# Patient Record
Sex: Female | Born: 1950 | Race: White | Hispanic: No | Marital: Married | Smoking: Former smoker
Health system: Southern US, Community
[De-identification: ages and names within clinical notes are randomized; demographics above are authoritative.]

## PROBLEM LIST (undated history)

## (undated) DIAGNOSIS — K759 Inflammatory liver disease, unspecified: Secondary | ICD-10-CM

## (undated) DIAGNOSIS — I6529 Occlusion and stenosis of unspecified carotid artery: Secondary | ICD-10-CM

## (undated) DIAGNOSIS — C801 Malignant (primary) neoplasm, unspecified: Secondary | ICD-10-CM

## (undated) DIAGNOSIS — I4891 Unspecified atrial fibrillation: Secondary | ICD-10-CM

## (undated) DIAGNOSIS — H539 Unspecified visual disturbance: Secondary | ICD-10-CM

## (undated) DIAGNOSIS — I272 Pulmonary hypertension, unspecified: Secondary | ICD-10-CM

## (undated) DIAGNOSIS — R011 Cardiac murmur, unspecified: Secondary | ICD-10-CM

## (undated) DIAGNOSIS — I10 Essential (primary) hypertension: Secondary | ICD-10-CM

## (undated) DIAGNOSIS — I499 Cardiac arrhythmia, unspecified: Secondary | ICD-10-CM

## (undated) DIAGNOSIS — H409 Unspecified glaucoma: Secondary | ICD-10-CM

## (undated) DIAGNOSIS — K219 Gastro-esophageal reflux disease without esophagitis: Secondary | ICD-10-CM

## (undated) DIAGNOSIS — R0600 Dyspnea, unspecified: Secondary | ICD-10-CM

## (undated) DIAGNOSIS — M1990 Unspecified osteoarthritis, unspecified site: Secondary | ICD-10-CM

## (undated) DIAGNOSIS — H9190 Unspecified hearing loss, unspecified ear: Secondary | ICD-10-CM

## (undated) HISTORY — PX: ABDOMINAL HYSTERECTOMY: SHX81

## (undated) HISTORY — PX: EYE SURGERY: SHX253

## (undated) HISTORY — DX: Essential (primary) hypertension: I10

## (undated) HISTORY — PX: COLONOSCOPY: SHX174

## (undated) HISTORY — DX: Cardiac murmur, unspecified: R01.1

## (undated) HISTORY — PX: SKIN CANCER EXCISION: SHX779

## (undated) HISTORY — PX: BREAST LUMPECTOMY: SHX2

---

## 1981-05-24 HISTORY — PX: TUBAL LIGATION: SHX77

## 1998-10-16 ENCOUNTER — Other Ambulatory Visit: Admission: RE | Admit: 1998-10-16 | Discharge: 1998-10-16 | Payer: Self-pay | Admitting: Obstetrics & Gynecology

## 1999-11-18 ENCOUNTER — Other Ambulatory Visit: Admission: RE | Admit: 1999-11-18 | Discharge: 1999-11-18 | Payer: Self-pay | Admitting: Obstetrics & Gynecology

## 2000-06-24 ENCOUNTER — Encounter: Payer: Self-pay | Admitting: General Surgery

## 2000-06-24 ENCOUNTER — Encounter: Admission: RE | Admit: 2000-06-24 | Discharge: 2000-06-24 | Payer: Self-pay | Admitting: General Surgery

## 2000-07-07 ENCOUNTER — Encounter: Admission: RE | Admit: 2000-07-07 | Discharge: 2000-07-07 | Payer: Self-pay | Admitting: General Surgery

## 2000-07-07 ENCOUNTER — Encounter: Payer: Self-pay | Admitting: General Surgery

## 2000-07-26 ENCOUNTER — Ambulatory Visit (HOSPITAL_BASED_OUTPATIENT_CLINIC_OR_DEPARTMENT_OTHER): Admission: RE | Admit: 2000-07-26 | Discharge: 2000-07-26 | Payer: Self-pay | Admitting: General Surgery

## 2000-08-08 ENCOUNTER — Encounter: Admission: RE | Admit: 2000-08-08 | Discharge: 2000-08-08 | Payer: Self-pay | Admitting: General Surgery

## 2000-08-08 ENCOUNTER — Encounter: Payer: Self-pay | Admitting: General Surgery

## 2001-05-29 ENCOUNTER — Other Ambulatory Visit: Admission: RE | Admit: 2001-05-29 | Discharge: 2001-05-29 | Payer: Self-pay | Admitting: Obstetrics & Gynecology

## 2002-10-05 ENCOUNTER — Other Ambulatory Visit: Admission: RE | Admit: 2002-10-05 | Discharge: 2002-10-05 | Payer: Self-pay | Admitting: Obstetrics & Gynecology

## 2012-06-22 ENCOUNTER — Encounter: Payer: Self-pay | Admitting: Gastroenterology

## 2013-03-08 ENCOUNTER — Other Ambulatory Visit: Payer: Self-pay | Admitting: Otolaryngology

## 2013-03-08 DIAGNOSIS — R1310 Dysphagia, unspecified: Secondary | ICD-10-CM

## 2013-03-14 ENCOUNTER — Ambulatory Visit
Admission: RE | Admit: 2013-03-14 | Discharge: 2013-03-14 | Disposition: A | Discharge status: Home / Self Care | Source: Ambulatory Visit | Attending: Otolaryngology | Admitting: Otolaryngology

## 2013-03-14 DIAGNOSIS — R1310 Dysphagia, unspecified: Secondary | ICD-10-CM

## 2013-04-06 ENCOUNTER — Encounter: Payer: Self-pay | Admitting: Gastroenterology

## 2013-12-28 DIAGNOSIS — H6120 Impacted cerumen, unspecified ear: Secondary | ICD-10-CM | POA: Diagnosis not present

## 2013-12-28 DIAGNOSIS — K219 Gastro-esophageal reflux disease without esophagitis: Secondary | ICD-10-CM | POA: Diagnosis not present

## 2013-12-28 DIAGNOSIS — J019 Acute sinusitis, unspecified: Secondary | ICD-10-CM | POA: Diagnosis not present

## 2013-12-28 DIAGNOSIS — H6980 Other specified disorders of Eustachian tube, unspecified ear: Secondary | ICD-10-CM | POA: Diagnosis not present

## 2014-01-18 DIAGNOSIS — H2510 Age-related nuclear cataract, unspecified eye: Secondary | ICD-10-CM | POA: Diagnosis not present

## 2014-01-18 DIAGNOSIS — H4011X0 Primary open-angle glaucoma, stage unspecified: Secondary | ICD-10-CM | POA: Diagnosis not present

## 2014-01-18 DIAGNOSIS — H409 Unspecified glaucoma: Secondary | ICD-10-CM | POA: Diagnosis not present

## 2014-01-23 DIAGNOSIS — H4010X0 Unspecified open-angle glaucoma, stage unspecified: Secondary | ICD-10-CM | POA: Diagnosis not present

## 2014-01-23 DIAGNOSIS — M25559 Pain in unspecified hip: Secondary | ICD-10-CM | POA: Diagnosis not present

## 2014-01-23 DIAGNOSIS — H409 Unspecified glaucoma: Secondary | ICD-10-CM | POA: Diagnosis not present

## 2014-02-13 DIAGNOSIS — M25559 Pain in unspecified hip: Secondary | ICD-10-CM | POA: Diagnosis not present

## 2014-02-13 DIAGNOSIS — M1610 Unilateral primary osteoarthritis, unspecified hip: Secondary | ICD-10-CM | POA: Diagnosis not present

## 2014-02-13 DIAGNOSIS — R195 Other fecal abnormalities: Secondary | ICD-10-CM | POA: Diagnosis not present

## 2014-02-19 ENCOUNTER — Ambulatory Visit: Payer: Medicare Other | Attending: Family Medicine | Admitting: Physical Therapy

## 2014-02-19 DIAGNOSIS — IMO0001 Reserved for inherently not codable concepts without codable children: Secondary | ICD-10-CM | POA: Diagnosis not present

## 2014-02-19 DIAGNOSIS — R5381 Other malaise: Secondary | ICD-10-CM | POA: Insufficient documentation

## 2014-02-19 DIAGNOSIS — M25559 Pain in unspecified hip: Secondary | ICD-10-CM | POA: Diagnosis not present

## 2014-02-21 ENCOUNTER — Ambulatory Visit: Payer: Medicare Other | Attending: Family Medicine | Admitting: Physical Therapy

## 2014-02-21 DIAGNOSIS — M25551 Pain in right hip: Secondary | ICD-10-CM | POA: Insufficient documentation

## 2014-02-21 DIAGNOSIS — Z5189 Encounter for other specified aftercare: Secondary | ICD-10-CM | POA: Insufficient documentation

## 2014-02-21 DIAGNOSIS — R5381 Other malaise: Secondary | ICD-10-CM | POA: Diagnosis not present

## 2014-02-25 DIAGNOSIS — K225 Diverticulum of esophagus, acquired: Secondary | ICD-10-CM | POA: Diagnosis not present

## 2014-02-25 DIAGNOSIS — K219 Gastro-esophageal reflux disease without esophagitis: Secondary | ICD-10-CM | POA: Diagnosis not present

## 2014-02-25 DIAGNOSIS — K921 Melena: Secondary | ICD-10-CM | POA: Diagnosis not present

## 2014-02-26 ENCOUNTER — Ambulatory Visit: Payer: Medicare Other | Admitting: Physical Therapy

## 2014-02-26 DIAGNOSIS — Z5189 Encounter for other specified aftercare: Secondary | ICD-10-CM | POA: Diagnosis not present

## 2014-02-28 ENCOUNTER — Ambulatory Visit: Payer: Medicare Other | Admitting: Physical Therapy

## 2014-02-28 DIAGNOSIS — Z5189 Encounter for other specified aftercare: Secondary | ICD-10-CM | POA: Diagnosis not present

## 2014-03-27 DIAGNOSIS — Z23 Encounter for immunization: Secondary | ICD-10-CM | POA: Diagnosis not present

## 2014-03-29 DIAGNOSIS — M5126 Other intervertebral disc displacement, lumbar region: Secondary | ICD-10-CM | POA: Diagnosis not present

## 2014-03-29 DIAGNOSIS — M5136 Other intervertebral disc degeneration, lumbar region: Secondary | ICD-10-CM | POA: Diagnosis not present

## 2014-03-29 DIAGNOSIS — M5137 Other intervertebral disc degeneration, lumbosacral region: Secondary | ICD-10-CM | POA: Diagnosis not present

## 2014-03-29 DIAGNOSIS — M5127 Other intervertebral disc displacement, lumbosacral region: Secondary | ICD-10-CM | POA: Diagnosis not present

## 2014-05-07 DIAGNOSIS — M25461 Effusion, right knee: Secondary | ICD-10-CM | POA: Diagnosis not present

## 2014-05-07 DIAGNOSIS — M25561 Pain in right knee: Secondary | ICD-10-CM | POA: Diagnosis not present

## 2014-05-08 DIAGNOSIS — M25461 Effusion, right knee: Secondary | ICD-10-CM | POA: Diagnosis not present

## 2014-05-08 DIAGNOSIS — M25561 Pain in right knee: Secondary | ICD-10-CM | POA: Diagnosis not present

## 2014-05-13 DIAGNOSIS — J22 Unspecified acute lower respiratory infection: Secondary | ICD-10-CM | POA: Diagnosis not present

## 2014-05-20 DIAGNOSIS — J40 Bronchitis, not specified as acute or chronic: Secondary | ICD-10-CM | POA: Diagnosis not present

## 2014-05-28 DIAGNOSIS — M25461 Effusion, right knee: Secondary | ICD-10-CM | POA: Diagnosis not present

## 2014-05-28 DIAGNOSIS — M25561 Pain in right knee: Secondary | ICD-10-CM | POA: Diagnosis not present

## 2014-07-03 DIAGNOSIS — Z9889 Other specified postprocedural states: Secondary | ICD-10-CM | POA: Diagnosis not present

## 2014-07-03 DIAGNOSIS — Z9841 Cataract extraction status, right eye: Secondary | ICD-10-CM | POA: Diagnosis not present

## 2014-07-03 DIAGNOSIS — H4011X3 Primary open-angle glaucoma, severe stage: Secondary | ICD-10-CM | POA: Diagnosis not present

## 2014-07-03 DIAGNOSIS — H2512 Age-related nuclear cataract, left eye: Secondary | ICD-10-CM | POA: Diagnosis not present

## 2014-07-23 DIAGNOSIS — M25561 Pain in right knee: Secondary | ICD-10-CM | POA: Diagnosis not present

## 2014-07-23 DIAGNOSIS — M25572 Pain in left ankle and joints of left foot: Secondary | ICD-10-CM | POA: Diagnosis not present

## 2014-08-01 DIAGNOSIS — H4011X3 Primary open-angle glaucoma, severe stage: Secondary | ICD-10-CM | POA: Diagnosis not present

## 2014-08-01 DIAGNOSIS — H2512 Age-related nuclear cataract, left eye: Secondary | ICD-10-CM | POA: Diagnosis not present

## 2014-08-12 DIAGNOSIS — R011 Cardiac murmur, unspecified: Secondary | ICD-10-CM | POA: Diagnosis not present

## 2014-08-12 DIAGNOSIS — I10 Essential (primary) hypertension: Secondary | ICD-10-CM | POA: Diagnosis not present

## 2014-08-14 ENCOUNTER — Ambulatory Visit (INDEPENDENT_AMBULATORY_CARE_PROVIDER_SITE_OTHER): Payer: Medicare Other | Admitting: Cardiovascular Disease

## 2014-08-14 ENCOUNTER — Encounter: Payer: Self-pay | Admitting: Cardiovascular Disease

## 2014-08-14 VITALS — BP 130/74 | HR 70 | Ht 64.0 in | Wt 139.4 lb

## 2014-08-14 DIAGNOSIS — I341 Nonrheumatic mitral (valve) prolapse: Secondary | ICD-10-CM

## 2014-08-14 DIAGNOSIS — I340 Nonrheumatic mitral (valve) insufficiency: Secondary | ICD-10-CM

## 2014-08-14 NOTE — Progress Notes (Signed)
Cardiology Office Note   Date:  08/14/2014   ID:  Laura Knapp, Laura Knapp [birth year], MRN [id number]  PCP:  No primary care provider on file.  Cardiologist:   Ilyssa Grennan, Wonda Cheng, MD   Chief Complaint  Patient presents with  . Heart Murmur   Problem List 1 Essential Hypertension 2. Heart murmur   August 14, 2014:    Laura Knapp is a 64 y.o. female who presents for evaluation of a heart murmur/ She has occasional palpitation that are are associated with brief chest pain .  She typically walks regularly but injurred her knee in December and has not been able to walk much since that time. She's not had any episodes of chest pain or shortness breath with exercise. She denies any syncope or presyncope.    Past Medical History  Diagnosis Date  . Hypertension   . Heart murmur     Past Surgical History  Procedure Laterality Date  . Breast lumpectomy      BOTH BREAST  . Tubal ligation  1983     Current Outpatient Prescriptions  Medication Sig Dispense Refill  . cyclobenzaprine (FLEXERIL) 10 MG tablet Take 10 mg by mouth 3 (three) times daily as needed for muscle spasms.    Marland Kitchen dexlansoprazole (DEXILANT) 60 MG capsule Take 60 mg by mouth daily.    . dorzolamide-timolol (COSOPT) 22.3-6.8 MG/ML ophthalmic solution Place 1 drop into both eyes 2 (two) times daily.    Marland Kitchen ibuprofen (ADVIL,MOTRIN) 200 MG tablet Take 200 mg by mouth every 6 (six) hours as needed.    . latanoprost (XALATAN) 0.005 % ophthalmic solution Place 1 drop into both eyes at bedtime.    Marland Kitchen lisinopril (PRINIVIL,ZESTRIL) 10 MG tablet Take 10 mg by mouth daily. 1/2 TABLET DAILY    . pilocarpine (PILOCAR) 4 % ophthalmic solution Place 1 drop into both eyes 4 (four) times daily.    Marland Kitchen zolpidem (AMBIEN) 10 MG tablet Take 10 mg by mouth at bedtime as needed for sleep.     No current facility-administered medications for this visit.    Allergies:   Codeine and Acetazolamide    Social History:  The patient  reports that  she has never smoked. She does not have any smokeless tobacco history on file. She reports that she does not drink alcohol or use illicit drugs.   Family History:  The patient's family history includes Heart Problems in her father; Heart disease in her paternal grandfather; Thyroid disease in her brother.    ROS:  Please see the history of present illness.    Review of Systems: Constitutional:  denies fever, chills, diaphoresis, appetite change and fatigue.  HEENT: admits to diplopia and glucome   Respiratory: denies SOB, DOE, cough, chest tightness, and wheezing.  Cardiovascular: admits to  palpitations   Gastrointestinal: denies nausea, vomiting, abdominal pain, diarrhea, constipation, blood in stool.  Genitourinary: denies dysuria, urgency, frequency, hematuria, flank pain and difficulty urinating.  Musculoskeletal: denies  myalgias, back pain, joint swelling, arthralgias and gait problem.   Skin: denies pallor, rash and wound.  Neurological: denies dizziness, seizures, syncope, weakness, light-headedness, numbness and headaches.   Hematological: denies adenopathy, easy bruising, personal or family bleeding history.  Psychiatric/ Behavioral: denies suicidal ideation, mood changes, confusion, nervousness, sleep disturbance and agitation.       All other systems are reviewed and negative.    PHYSICAL EXAM: VS:  BP 130/74 mmHg  Pulse 70  Ht 5\' 4"  (1.626 m)  Wt 139  lb 6.4 oz (63.231 kg)  BMI 23.92 kg/m2 , BMI Body mass index is 23.92 kg/(m^2). GEN: Well nourished, well developed, in no acute distress HEENT: normal Neck: no JVD, carotid bruits, or masses Cardiac: RR;she has a 2/6 systolic murmur at the left sternal border.  I was not able to hear any murmur toward the axilla.  No  rubs, or gallops,no edema  Respiratory:  clear to auscultation bilaterally, normal work of breathing GI: soft, nontender, nondistended, + BS MS: no deformity or atrophy Skin: warm and dry, no  rash Neuro:  Strength and sensation are intact Psych: normal   EKG:  EKG is ordered today. The ekg ordered today demonstrates NSR at 70.  Normal ECG .    Recent Labs: No results found for requested labs within last 365 days.    Lipid Panel No results found for: CHOL, TRIG, HDL, CHOLHDL, VLDL, LDLCALC, LDLDIRECT    Wt Readings from Last 3 Encounters:  08/14/14 139 lb 6.4 oz (63.231 kg)      Other studies Reviewed: Additional studies/ records that were reviewed today include: . Review of the above records demonstrates:    ASSESSMENT AND PLAN:  1.  Systolic heart murmur: The patient has a late peaking systolic murmur consistent with mitral valve prolapse and mitral regurgitation. She does not have any signs or symptoms of congestive heart failure. We will get an echo card gram for further assessment of her valvular function. I'll plan on seeing her again in 6 months. I'll see her yearly after that. At this point I do not think that she needs any medication changes.   Current medicines are reviewed at length with the patient today.  The patient does not have concerns regarding medicines.  The following changes have been made:  no change  Labs/ tests ordered today include:  No orders of the defined types were placed in this encounter.     Disposition:   FU with me in 6 months.     Signed, Eames Dibiasio, Wonda Cheng, MD  08/14/2014 2:55 PM    [address], [place],   [address] Phone: [phone]; Fax: [phone]

## 2014-08-14 NOTE — Patient Instructions (Signed)
Your physician recommends that you continue on your current medications as directed. Please refer to the Current Medication list given to you today.  Your physician has requested that you have an echocardiogram. Echocardiography is a painless test that uses sound waves to create images of your heart. It provides your doctor with information about the size and shape of your heart and how well your heart's chambers and valves are working. This procedure takes approximately one hour. There are no restrictions for this procedure.  Your physician wants you to follow-up in: 6 month follow up with Dr. Acie Fredrickson. You will receive a reminder letter in the mail two months in advance. If you don't receive a letter, please call our office to schedule the follow-up appointment.

## 2014-08-20 DIAGNOSIS — M25572 Pain in left ankle and joints of left foot: Secondary | ICD-10-CM | POA: Diagnosis not present

## 2014-08-20 DIAGNOSIS — M25561 Pain in right knee: Secondary | ICD-10-CM | POA: Diagnosis not present

## 2014-08-21 ENCOUNTER — Ambulatory Visit (HOSPITAL_COMMUNITY): Payer: Medicare Other | Attending: Cardiovascular Disease

## 2014-08-21 DIAGNOSIS — I341 Nonrheumatic mitral (valve) prolapse: Secondary | ICD-10-CM | POA: Insufficient documentation

## 2014-08-21 DIAGNOSIS — I509 Heart failure, unspecified: Secondary | ICD-10-CM | POA: Diagnosis present

## 2014-08-21 DIAGNOSIS — I340 Nonrheumatic mitral (valve) insufficiency: Secondary | ICD-10-CM | POA: Diagnosis not present

## 2014-08-21 NOTE — Progress Notes (Signed)
2D Echo completed. 08/21/2014

## 2014-08-26 DIAGNOSIS — G4700 Insomnia, unspecified: Secondary | ICD-10-CM | POA: Diagnosis not present

## 2014-08-26 DIAGNOSIS — I10 Essential (primary) hypertension: Secondary | ICD-10-CM | POA: Diagnosis not present

## 2014-08-27 DIAGNOSIS — M25561 Pain in right knee: Secondary | ICD-10-CM | POA: Diagnosis not present

## 2014-09-03 DIAGNOSIS — M25572 Pain in left ankle and joints of left foot: Secondary | ICD-10-CM | POA: Diagnosis not present

## 2014-09-03 DIAGNOSIS — M25461 Effusion, right knee: Secondary | ICD-10-CM | POA: Diagnosis not present

## 2014-09-16 DIAGNOSIS — H4011X3 Primary open-angle glaucoma, severe stage: Secondary | ICD-10-CM | POA: Diagnosis not present

## 2014-09-16 DIAGNOSIS — H43812 Vitreous degeneration, left eye: Secondary | ICD-10-CM | POA: Diagnosis not present

## 2014-11-01 DIAGNOSIS — H4011X3 Primary open-angle glaucoma, severe stage: Secondary | ICD-10-CM | POA: Diagnosis not present

## 2014-11-01 DIAGNOSIS — H2512 Age-related nuclear cataract, left eye: Secondary | ICD-10-CM | POA: Diagnosis not present

## 2014-11-01 DIAGNOSIS — Z87891 Personal history of nicotine dependence: Secondary | ICD-10-CM | POA: Diagnosis not present

## 2015-01-10 DIAGNOSIS — H4011X3 Primary open-angle glaucoma, severe stage: Secondary | ICD-10-CM | POA: Diagnosis not present

## 2015-02-12 DIAGNOSIS — I10 Essential (primary) hypertension: Secondary | ICD-10-CM | POA: Diagnosis not present

## 2015-02-12 DIAGNOSIS — J22 Unspecified acute lower respiratory infection: Secondary | ICD-10-CM | POA: Diagnosis not present

## 2015-03-13 DIAGNOSIS — M79676 Pain in unspecified toe(s): Secondary | ICD-10-CM | POA: Diagnosis not present

## 2015-03-13 DIAGNOSIS — B351 Tinea unguium: Secondary | ICD-10-CM | POA: Diagnosis not present

## 2015-04-03 DIAGNOSIS — H401133 Primary open-angle glaucoma, bilateral, severe stage: Secondary | ICD-10-CM | POA: Diagnosis not present

## 2015-04-14 DIAGNOSIS — H401133 Primary open-angle glaucoma, bilateral, severe stage: Secondary | ICD-10-CM | POA: Diagnosis not present

## 2015-07-07 DIAGNOSIS — R0602 Shortness of breath: Secondary | ICD-10-CM | POA: Diagnosis not present

## 2015-07-07 DIAGNOSIS — I10 Essential (primary) hypertension: Secondary | ICD-10-CM | POA: Diagnosis not present

## 2015-07-07 DIAGNOSIS — R011 Cardiac murmur, unspecified: Secondary | ICD-10-CM | POA: Diagnosis not present

## 2015-07-07 DIAGNOSIS — F5101 Primary insomnia: Secondary | ICD-10-CM | POA: Diagnosis not present

## 2015-07-07 DIAGNOSIS — R600 Localized edema: Secondary | ICD-10-CM | POA: Diagnosis not present

## 2015-07-09 DIAGNOSIS — R0602 Shortness of breath: Secondary | ICD-10-CM | POA: Diagnosis not present

## 2015-07-09 DIAGNOSIS — R600 Localized edema: Secondary | ICD-10-CM | POA: Diagnosis not present

## 2015-07-09 DIAGNOSIS — I10 Essential (primary) hypertension: Secondary | ICD-10-CM | POA: Diagnosis not present

## 2015-07-09 DIAGNOSIS — I341 Nonrheumatic mitral (valve) prolapse: Secondary | ICD-10-CM | POA: Diagnosis not present

## 2015-07-24 DIAGNOSIS — I341 Nonrheumatic mitral (valve) prolapse: Secondary | ICD-10-CM | POA: Diagnosis not present

## 2015-07-24 DIAGNOSIS — R0602 Shortness of breath: Secondary | ICD-10-CM | POA: Diagnosis not present

## 2015-07-24 DIAGNOSIS — R0989 Other specified symptoms and signs involving the circulatory and respiratory systems: Secondary | ICD-10-CM | POA: Diagnosis not present

## 2015-07-28 DIAGNOSIS — I4891 Unspecified atrial fibrillation: Secondary | ICD-10-CM | POA: Diagnosis not present

## 2015-07-28 DIAGNOSIS — I340 Nonrheumatic mitral (valve) insufficiency: Secondary | ICD-10-CM | POA: Diagnosis not present

## 2015-07-28 DIAGNOSIS — R600 Localized edema: Secondary | ICD-10-CM | POA: Diagnosis not present

## 2015-07-28 DIAGNOSIS — R0602 Shortness of breath: Secondary | ICD-10-CM | POA: Diagnosis not present

## 2015-08-04 DIAGNOSIS — I4891 Unspecified atrial fibrillation: Secondary | ICD-10-CM | POA: Diagnosis not present

## 2015-08-04 DIAGNOSIS — R0602 Shortness of breath: Secondary | ICD-10-CM | POA: Diagnosis not present

## 2015-08-04 DIAGNOSIS — I340 Nonrheumatic mitral (valve) insufficiency: Secondary | ICD-10-CM | POA: Diagnosis not present

## 2015-08-04 DIAGNOSIS — I10 Essential (primary) hypertension: Secondary | ICD-10-CM | POA: Diagnosis not present

## 2015-08-15 DIAGNOSIS — H1045 Other chronic allergic conjunctivitis: Secondary | ICD-10-CM | POA: Diagnosis not present

## 2015-08-15 DIAGNOSIS — Q132 Other congenital malformations of iris: Secondary | ICD-10-CM | POA: Diagnosis not present

## 2015-08-15 DIAGNOSIS — H1031 Unspecified acute conjunctivitis, right eye: Secondary | ICD-10-CM | POA: Diagnosis not present

## 2015-08-17 DIAGNOSIS — I4891 Unspecified atrial fibrillation: Secondary | ICD-10-CM | POA: Diagnosis not present

## 2015-08-20 DIAGNOSIS — I480 Paroxysmal atrial fibrillation: Secondary | ICD-10-CM | POA: Diagnosis not present

## 2015-08-20 DIAGNOSIS — R0602 Shortness of breath: Secondary | ICD-10-CM | POA: Diagnosis not present

## 2015-08-20 DIAGNOSIS — I340 Nonrheumatic mitral (valve) insufficiency: Secondary | ICD-10-CM | POA: Diagnosis not present

## 2015-08-20 DIAGNOSIS — I6522 Occlusion and stenosis of left carotid artery: Secondary | ICD-10-CM | POA: Diagnosis not present

## 2015-08-27 DIAGNOSIS — I10 Essential (primary) hypertension: Secondary | ICD-10-CM | POA: Diagnosis not present

## 2015-09-08 NOTE — H&P (Signed)
OFFICE VISIT NOTES COPIED TO EPIC FOR DOCUMENTATION  Eliana Thelin 09-13-15 8:25 AM Location: [facility] Patient #: [id number] DOB: May 22, 1951 Married / Language: Cleophus Molt / Race: White Female   History of Present Illness Neldon Labella AGNP-C; 13-Sep-2015 11:10 AM) The patient is a 65 year old female who presents for a Follow-up for Shortness of breath. She presented for evaluation of worsening dyspnea on exertion over the past month with associated lower extremity edema and orthopnea. She was seen by a cardiologist approximately one year ago and diagnosed with moderate mitral valve prolapse with MR and moderate hypertension at that time. She has not followed up since then. She was recently seen by her PCP for significantly worsening shortness of breath and had lab work which revealed BNP of 253. She initally denied any chest pain, dizziness, syncope, or symptoms suggestive of claudication or TIA.  She was scheduled for echocardiogram as well as carotid duplex due to bruit noted on exam. Echocardiogram revealed normal LVEF with moderate to moderately severe MR. During echocardiogram, she was noted to be in atrial arrhythmia. She was scheduled for follow-up appointment to obtain an EKG and was found to be in A. fib versus a flutter with RVR. She was started on digoxin and metoprolol as well as Pradaxa. On follow up, she continued to be markedly symptomatic on certain days, but other days she felt better. An event monitor was placed to evaluate for correlation of symptoms to atrial arrhythmia. She presents today for follow up.   Problem List/Past Medical Franky Macho Reader; 09/13/2015 10:13 AM) Shortness of breath (R06.02) Benign essential hypertension (I10) Pulmonary hypertension (I27.2) Echocardiogram 07/24/2015: Left ventricle cavity is normal in size. Normal global wall motion. Visual EF is 50-55%. Unable to evaluate diastolic function due to Atrial arrhythmia. MV not  well visualized. Appears mildly thickened. Anteriorly directed moderate MR. Cannot exclude MVP. Mild tricuspid regurgitation. No evidence of pulmonary hypertension. Echocardiogram 08/21/2014: LV size and thickness normal. LVEF 60-65%. No regional wall motion abnormalities. Mild mitral valve prolapse involving the posterior leaflet with moderate regurgitation. PASP 56 mmHg Mitral valve insufficiency, unspecified etiology (I34.0) Echocardiogram 08/21/2014: LV size and thickness normal. LVEF 60-65%. No regional wall motion abnormalities. Mild mitral valve prolapse involving the posterior leaflet with moderate regurgitation. PASP 56 mmHg Bilateral edema of lower extremity (R60.0) Bruit (R09.89) Carotid artery duplex: 07/24/2015: Stenosis in the left internal carotid artery (16-49%), upper end of spectrum. Antegrade right vertebral artery flow. Antegrade left vertebral artery flow. Follow up in one year is appropriate if clinically indicated. New onset atrial fibrillation (I48.91) CHA2DS2-VASc Score is 4 with yearly risk of stroke of 4.0%. HAS-Bled score is 1 and estimated major bleeding in one year is 1.02-1.5% Event Monitor Episodic transmission 08/05/2015: A. Fib with CVR. GERD (gastroesophageal reflux disease) (K21.9) Glaucoma (H40.9) Heart murmur, systolic ([id number]) Insomnia (G47.00)  Allergies (Charav[place] Reader; September 13, 2015 10:13 AM) AcetaZOLAMIDE *CHEMICALS* Rash. Cold, Numbness in Fingers & Toes Codeine/Codeine Derivatives Itching. Lisinopril *ANTIHYPERTENSIVES* Cough. AmLODIPine Besylate *CHEMICALS* Fatigue  Family History Franky Macho Reader; 13-Sep-2015 10:13 AM) Mother Deceased. at a young age from Suicide Father Unknown History Brother 1 1/2 - Younger  Social History ([place] Reader; 09-13-15 10:13 AM) Current tobacco use Former smoker. Quit in 1991 Alcohol Use Moderate alcohol use. 1-2 before bedtime Marital status Married. Number of Children 3. Living Situation Lives  with spouse.  Past Surgical History Franky Macho Reader; 09/13/2015 10:13 AM) [id number] Bilateral. Arthroscopic Knee Surgery - [id number] Hysterectomy (not due to cancer) [id number] TRABECULECTOMY06/2012 Right.  Medication  History Franky Macho Reader; 08/20/2015 10:14 AM) Metoprolol Tartrate (50MG  Tablet, 1 (one) Tablet Tablet Oral two times daily, Taken starting 07/28/2015) Active. (Increased dose from 25mg  bid) Digoxin (250MCG Tablet, 1 (one) Tablet Tablet Oral daily, Taken starting 07/28/2015) Active. (Take 1 tab every 2 hours x 3 on day 1. Take 1 tab daily beginning on day 2.) Pradaxa (150MG  Capsule, 1 Capsule Capsule Oral 2 times per day for atrial fibrillation, Taken starting 07/28/2015) Active. Amoxicillin (500MG  Capsule, 4 Capsule Capsule Capsule Oral one hour prior to dental or invasive procedures, Taken starting 07/09/2015) Active. Ensure Clear (1 Oral daily) Active. Valsartan (80MG  Tablet, 1 Oral daily) Active. Dexilant (60MG  Capsule DR, 1 Oral daily) Active. Zolpidem Tartrate (10MG  Tablet, 1/2 Oral at bedtime as needed) Active. Pilocarpine HCl (4% Solution, 1 drop both eyes Ophthalmic three times daily) Active. Latanoprost (0.005% Solution, 1 drop both eyes Ophthalmic two times daily) Active. Brimonidine Tartrate (0.2% Solution, 1 drop both eyes Ophthalmic two times daily) Active. Cosopt (22.3-6.8MG /ML Solution, 1 drop both eyes Ophthalmic two times daily) Active. Ventolin HFA (108 (90 Base)MCG/ACT Aerosol Soln, 1 puff Inhalation as needed) Active. Furosemide (40MG  Tablet, 1 Oral daily) Active. Medications Reconciled (Verbally with patient.)  Diagnostic Studies History Franky Macho Reader; 08/20/2015 10:13 AM) Worthy Keeler 07/07/2015: CBC normal, BNP 253, creatinine 0.95, potassium 4.8 Nuclear stress test2011 Colonoscopy2012 removed benign polyps Endoscopy2014 GERD Echocardiogram03/30/2016 LV size and thickness normal. LVEF 60-65%. No regional wall motion  abnormalities. Mild mitral valve prolapse involving the posterior leaflet with moderate regurgitation. PASP 56 mmHg Carotid Doppler03/06/2015 Stenosis in the left internal carotid artery (16-49%), upper end of spectrum. Antegrade right vertebral artery flow. Antegrade left vertebral artery flow. Follow up in one year is appropriate if clinically indicated.    Review of Systems [facility] Ebony Hail, [place]; 08/20/2015 11:13 AM) General Not Present- Anorexia, Fatigue and Fever. Respiratory Present- Decreased Exercise Tolerance, Difficulty Breathing on Exertion and Dyspnea. Not Present- Cough. Cardiovascular Not Present- Chest Pain, Claudications, Edema, Orthopnea, Palpitations and Paroxysmal Nocturnal Dyspnea. Gastrointestinal Not Present- Black, Tarry Stool, Change in Bowel Habits and Nausea. Neurological Not Present- Focal Neurological Symptoms and Syncope. Endocrine Not Present- Cold Intolerance, Excessive Sweating, Heat Intolerance and Thyroid Problems. Hematology Not Present- Anemia, Easy Bruising, Petechiae and Prolonged Bleeding.  Vitals Franky Macho Reader; 08/20/2015 10:13 AM) 08/20/2015 10:13 AM Weight: 137.13 lb Height: 64in Body Surface Area: 1.67 m Body Mass Index: 23.54 kg/m  Pulse: 51 (Regular)  P.OX: 98% (Room air) BP: 128/70 (Sitting, Left Arm, Standard)       Physical Exam (Bridgette Allison AGNP-C; 08/20/2015 11:10 AM) General Mental Status-Alert. General Appearance-Cooperative and Appears stated age. Build & Nutrition-Lean and Petite.  Head and Neck Thyroid Gland Characteristics - normal size and consistency and no palpable nodules.  Chest and Lung Exam Chest and lung exam reveals -quiet, even and easy respiratory effort with no use of accessory muscles, non-tender and on auscultation, normal breath sounds, no adventitious sounds.  Cardiovascular Cardiovascular examination reveals -abdominal aorta auscultation reveals no bruits and no prominent  pulsation, femoral artery auscultation bilaterally reveals normal pulses, no bruits, no thrills and normal pedal pulses bilaterally. Auscultation Rhythm - Regular. Heart Sounds - S1 Diminished intensity and S2 WNL. Murmurs & Other Heart Sounds: Murmur - Location - Apex. Timing - Holosystolic. Grade - III/VI. Radiation - Carotids and Heart base.  Abdomen Palpation/Percussion Normal exam - Non Tender and No hepatosplenomegaly.  Peripheral Vascular Carotid arteries - Left-Carotid bruit.  Neurologic Neurologic evaluation reveals -alert and oriented x 3 with no impairment of recent or  remote memory. Motor-Grossly intact without any focal deficits.  Musculoskeletal Global Assessment Left Lower Extremity - no deformities, masses or tenderness, no known fractures. Right Lower Extremity - no deformities, masses or tenderness, no known fractures.    Assessment & Plan (Bridgette Allison AGNP-C; 08/20/2015 11:13 AM)  Paroxysmal atrial fibrillation (I48.0) Story: CHA2DS2-VASc Score is 4 with yearly risk of stroke of 4.0%. HAS-Bled score is 1 and estimated major bleeding in one year is 1.02-1.5%  Event Monitor Episodic transmission 08/05/2015: A. Fib with CVR. Impression: EKG 08/04/2015: Sinus bradycardia at a rate of 56 bpm, normal axis, first-degree AV block, no evidence of ischemia. PACs.  EKG 07/28/2015: Atrial fibrillation versus atypical flutter with RVR with ventricular rates of 140-150. Normal axis. Poor R-wave progression, low voltage complexes. Current Plans Complete electrocardiogram (93000)  Shortness of breath (R06.02) Impression: EKG 07/09/2015: Sinus rhythm at a rate of 74 bpm, normal axis, left atrial enlargement, no evidence of ischemia. Mitral valve insufficiency, unspecified etiology (I34.0) Story: Echocardiogram 08/21/2014: LV size and thickness normal. LVEF 60-65%. No regional wall motion abnormalities. Mild mitral valve prolapse involving the posterior leaflet with  moderate regurgitation. PASP 56 mmHg  Left carotid stenosis [id number]) Story: Carotid artery duplex: 07/24/2015: Stenosis in the left internal carotid artery (16-49%), upper end of spectrum. Antegrade right vertebral artery flow. Antegrade left vertebral artery flow. Follow up in one year is appropriate if clinically indicated. Current Plans Started Crestor 10MG , 1 (one) Tablet daily, #30, 08/20/2015, Ref. x1. Future Plans 07/27/2016: Complete duplex scan of both carotid arteries [id number]) - one time Labwork Story: 07/07/2015: CBC normal, BNP 253, creatinine 0.95, potassium 4.8  Benign essential hypertension (I10)   Current Plans Mechanism of underlying disease process and action of medications discussed with the patient. I discussed primary/secondary prevention and also dietary counseling was done. She presents for follow-up of atrial fib and mitral valve insufficiency. She continues to have episodes of shortness of breath. Clinically MR seems worse than documented by TTE. Will schedule for TEE for further evaluation. If this confirms only moderate MR, will then schedule for nuclear stress test and consider flecainide for maintenance of sinus rhythm. If TEE reveals severe MR, will then need coronary angiography and referral for evaluation for MV repair/replacement. I've added Crestor d/t carotid athersclerosis and will obtain a baseline NMR panel as well. Follow up after TEE for reevaluation and further recommendation.  *I have discussed this case with Dr. Einar Gip and he personally examined the patient and participated in formulating the plan.*  LIPOPROTEIN, BLD, BY NMR ([id number])   Signed by Neldon Labella, AGNP-C (08/20/2015 11:13 AM)

## 2015-09-09 ENCOUNTER — Ambulatory Visit (HOSPITAL_COMMUNITY)
Admission: RE | Admit: 2015-09-09 | Discharge: 2015-09-09 | Disposition: A | Discharge status: Home / Self Care | Payer: Medicare Other | Source: Ambulatory Visit | Attending: Cardiology | Admitting: Cardiology

## 2015-09-09 ENCOUNTER — Encounter (HOSPITAL_COMMUNITY)
Admission: RE | Disposition: A | Discharge status: Home / Self Care | Payer: Self-pay | Source: Ambulatory Visit | Attending: Cardiology

## 2015-09-09 ENCOUNTER — Encounter (HOSPITAL_COMMUNITY): Payer: Self-pay | Admitting: *Deleted

## 2015-09-09 DIAGNOSIS — K219 Gastro-esophageal reflux disease without esophagitis: Secondary | ICD-10-CM | POA: Insufficient documentation

## 2015-09-09 DIAGNOSIS — I340 Nonrheumatic mitral (valve) insufficiency: Secondary | ICD-10-CM | POA: Insufficient documentation

## 2015-09-09 DIAGNOSIS — Z79899 Other long term (current) drug therapy: Secondary | ICD-10-CM | POA: Insufficient documentation

## 2015-09-09 DIAGNOSIS — Z791 Long term (current) use of non-steroidal anti-inflammatories (NSAID): Secondary | ICD-10-CM | POA: Diagnosis not present

## 2015-09-09 DIAGNOSIS — I480 Paroxysmal atrial fibrillation: Secondary | ICD-10-CM | POA: Insufficient documentation

## 2015-09-09 DIAGNOSIS — I341 Nonrheumatic mitral (valve) prolapse: Secondary | ICD-10-CM | POA: Insufficient documentation

## 2015-09-09 DIAGNOSIS — Z87891 Personal history of nicotine dependence: Secondary | ICD-10-CM | POA: Insufficient documentation

## 2015-09-09 DIAGNOSIS — I10 Essential (primary) hypertension: Secondary | ICD-10-CM | POA: Diagnosis not present

## 2015-09-09 HISTORY — PX: TEE WITHOUT CARDIOVERSION: SHX5443

## 2015-09-09 SURGERY — ECHOCARDIOGRAM, TRANSESOPHAGEAL
Anesthesia: Moderate Sedation

## 2015-09-09 MED ORDER — BUTAMBEN-TETRACAINE-BENZOCAINE 2-2-14 % EX AERO
INHALATION_SPRAY | CUTANEOUS | Status: DC | PRN
  Administered 2015-09-09: 2 via TOPICAL

## 2015-09-09 MED ORDER — FENTANYL CITRATE (PF) 100 MCG/2ML IJ SOLN
INTRAMUSCULAR | Status: AC
  Filled 2015-09-09: qty 2

## 2015-09-09 MED ORDER — SODIUM CHLORIDE 0.9 % IV SOLN
INTRAVENOUS | Status: DC
  Administered 2015-09-09: 500 mL via INTRAVENOUS

## 2015-09-09 MED ORDER — MIDAZOLAM HCL 10 MG/2ML IJ SOLN
INTRAMUSCULAR | Status: DC | PRN
Start: 2015-09-09 — End: 2015-09-09
  Administered 2015-09-09: 2 mg via INTRAVENOUS

## 2015-09-09 MED ORDER — FENTANYL CITRATE (PF) 100 MCG/2ML IJ SOLN
INTRAMUSCULAR | Status: DC | PRN
  Administered 2015-09-09: 25 ug via INTRAVENOUS

## 2015-09-09 MED ORDER — MIDAZOLAM HCL 5 MG/ML IJ SOLN
INTRAMUSCULAR | Status: AC
  Filled 2015-09-09: qty 2

## 2015-09-09 NOTE — Op Note (Signed)
TEE: Normal LVEF. Mild myxomatous degeneration of the MV leaflets with prolapse of the posterior leaflet, Moderate to at most mod-severe anteriorly directed MR without reversal of flow in all 4 pulmonary veins. Trace TR.  Mild atheresclerotic plaque aorta.

## 2015-09-09 NOTE — Interval H&P Note (Signed)
History and Physical Interval Note:  09/09/2015 11:08 AM  Laura Knapp  has presented today for surgery, with the diagnosis of MITRAL REGURGITATION   The various methods of treatment have been discussed with the patient and family. After consideration of risks, benefits and other options for treatment, the patient has consented to  Procedure(s): TRANSESOPHAGEAL ECHOCARDIOGRAM (TEE) (N/A) as a surgical intervention .  The patient's history has been reviewed, patient examined, no change in status, stable for surgery.  I have reviewed the patient's chart and labs.  Questions were answered to the patient's satisfaction.     Adrian Prows

## 2015-09-09 NOTE — Discharge Instructions (Signed)
Moderate Conscious Sedation, Adult, Care After °Refer to this sheet in the next few weeks. These instructions provide you with information on caring for yourself after your procedure. Your health care provider may also give you more specific instructions. Your treatment has been planned according to current medical practices, but problems sometimes occur. Call your health care provider if you have any problems or questions after your procedure. °WHAT TO EXPECT AFTER THE PROCEDURE  °After your procedure: °· You may feel sleepy, clumsy, and have poor balance for several hours. °· Vomiting may occur if you eat too soon after the procedure. °HOME CARE INSTRUCTIONS °· Do not participate in any activities where you could become injured for at least 24 hours. Do not: °¨ Drive. °¨ Swim. °¨ Ride a bicycle. °¨ Operate heavy machinery. °¨ Cook. °¨ Use power tools. °¨ Climb ladders. °¨ Work from a high place. °· Do not make important decisions or sign legal documents until you are improved. °· If you vomit, drink water, juice, or soup when you can drink without vomiting. Make sure you have little or no nausea before eating solid foods. °· Only take over-the-counter or prescription medicines for pain, discomfort, or fever as directed by your health care provider. °· Make sure you and your family fully understand everything about the medicines given to you, including what side effects may occur. °· You should not drink alcohol, take sleeping pills, or take medicines that cause drowsiness for at least 24 hours. °· If you smoke, do not smoke without supervision. °· If you are feeling better, you may resume normal activities 24 hours after you were sedated. °· Keep all appointments with your health care provider. °SEEK MEDICAL CARE IF: °· Your skin is pale or bluish in color. °· You continue to feel nauseous or vomit. °· Your pain is getting worse and is not helped by medicine. °· You have bleeding or swelling. °· You are still  sleepy or feeling clumsy after 24 hours. °SEEK IMMEDIATE MEDICAL CARE IF: °· You develop a rash. °· You have difficulty breathing. °· You develop any type of allergic problem. °· You have a fever. °MAKE SURE YOU: °· Understand these instructions. °· Will watch your condition. °· Will get help right away if you are not doing well or get worse. °  °This information is not intended to replace advice given to you by your health care provider. Make sure you discuss any questions you have with your health care provider. °  °Document Released: 02/28/2013 Document Revised: 05/31/2014 Document Reviewed: 02/28/2013 °Elsevier Interactive Patient Education ©2016 [employer] ° ° °

## 2015-09-09 NOTE — Progress Notes (Signed)
  Echocardiogram Echocardiogram Transesophageal has been performed.  Laura Knapp 09/09/2015, 11:45 AM

## 2015-09-10 ENCOUNTER — Encounter (HOSPITAL_COMMUNITY): Payer: Self-pay | Admitting: Cardiology

## 2015-09-18 DIAGNOSIS — I6522 Occlusion and stenosis of left carotid artery: Secondary | ICD-10-CM | POA: Diagnosis not present

## 2015-09-18 DIAGNOSIS — I340 Nonrheumatic mitral (valve) insufficiency: Secondary | ICD-10-CM | POA: Diagnosis not present

## 2015-09-18 DIAGNOSIS — R0602 Shortness of breath: Secondary | ICD-10-CM | POA: Diagnosis not present

## 2015-09-18 DIAGNOSIS — I480 Paroxysmal atrial fibrillation: Secondary | ICD-10-CM | POA: Diagnosis not present

## 2015-09-22 DIAGNOSIS — I4891 Unspecified atrial fibrillation: Secondary | ICD-10-CM | POA: Diagnosis not present

## 2015-09-22 DIAGNOSIS — R0602 Shortness of breath: Secondary | ICD-10-CM | POA: Diagnosis not present

## 2015-09-22 DIAGNOSIS — I10 Essential (primary) hypertension: Secondary | ICD-10-CM | POA: Diagnosis not present

## 2015-09-29 DIAGNOSIS — R011 Cardiac murmur, unspecified: Secondary | ICD-10-CM | POA: Insufficient documentation

## 2015-10-02 DIAGNOSIS — L03031 Cellulitis of right toe: Secondary | ICD-10-CM | POA: Diagnosis not present

## 2015-10-06 DIAGNOSIS — I6522 Occlusion and stenosis of left carotid artery: Secondary | ICD-10-CM | POA: Diagnosis not present

## 2015-10-06 DIAGNOSIS — I340 Nonrheumatic mitral (valve) insufficiency: Secondary | ICD-10-CM | POA: Diagnosis not present

## 2015-10-06 DIAGNOSIS — I480 Paroxysmal atrial fibrillation: Secondary | ICD-10-CM | POA: Diagnosis not present

## 2015-10-06 DIAGNOSIS — R0602 Shortness of breath: Secondary | ICD-10-CM | POA: Diagnosis not present

## 2015-10-10 DIAGNOSIS — R0602 Shortness of breath: Secondary | ICD-10-CM | POA: Diagnosis not present

## 2015-10-15 DIAGNOSIS — R0600 Dyspnea, unspecified: Secondary | ICD-10-CM | POA: Diagnosis present

## 2015-10-15 DIAGNOSIS — R0609 Other forms of dyspnea: Secondary | ICD-10-CM

## 2015-10-15 NOTE — H&P (Signed)
OFFICE VISIT NOTES COPIED TO EPIC FOR DOCUMENTATION  . History of Present Illness Laverda Page MD; 10/06/2015 1:37 PM) Patient words: Last O/V 09/18/2015; F/U Nuc results; after using a few medications she has started getting rashes all over her body. Pt states that she is taking 30m of Zetia but, it says that she is prescribed 115mof Zetia in her chart.  The patient is a 89ear old female who presents for a follow-up for Shortness of breath. Patient here for follow-up of mitral regurgitation and dyspnea. She continues to complain of marked dyspnea on exertion with minimal activities and alsoo fatigue. TEE performed on 09/09/2015 revealing moderate mitral valve regurgitation, posterior mitral valve leaflet mitral valve prolapse. This was consistent with transthoracic echocardiogram. Symptoms on going for the past 2-3 months.  During echocardiogram, she was noted to be in atrial fibrillation with RVR. She was started on digoxin and metoprolol as well as Pradaxa. Tolerating all her medications well and due paroxysmal A. Fib with RVR, used digoxin for rate control alsong with Metoprolol. She has noticed some "bumps under skin" since being on cardiac medications. No symptoms from that.  Additional reasons for visit:  Follow-up for Mitral regurgitation    Problem List/Past Medical (April Garrison; 10/06/2015 12:45 PM) Shortness of breath (R06.02)  Exercise myoview stress 09/22/2015: 1. Resting EKG demonstrated normal sinus rhythm, normal axis. No evidence of ischemia. Stress EKG is positive for myocardial ischemia. With exercise there is frequent PVCs, ventricular couplets and ventricular complaints, associated with 2 mm horizontal ST segment depression in the inferior and lateral leads. ST segment changes persisted for greater than 3 minutes into recovery. Stress terminated due to fatigue. The patient performed treadmill exercise using a Bruce protocol, completing 6 minutes. The patient  completed an estimated workload of 7.05 METS. 2. Myocardial perfusion imaging is normal. Overall left ventricular systolic function was normal without regional wall motion abnormalities. The left ventricular ejection fraction was 89%. This is an intermediate risk study, clinical correlation recommended.This is an intermediate risk study due to arrhythmias and ST changes of ischemia. Multivessel disease and balanced ischemia cannot be excluded. Consider further cardiac work-up. Benign essential hypertension (I10)  Pulmonary hypertension (I27.2)  Echocardiogram 07/24/2015: Left ventricle cavity is normal in size. Normal global wall motion. Visual EF is 50-55%. Unable to evaluate diastolic function due to Atrial arrhythmia. MV not well visualized. Appears mildly thickened. Anteriorly directed moderate MR. Cannot exclude MVP. Mild tricuspid regurgitation. No evidence of pulmonary hypertension. Echocardiogram 08/21/2014: LV size and thickness normal. LVEF 60-65%. No regional wall motion abnormalities. Mild mitral valve prolapse involving the posterior leaflet with moderate regurgitation. PASP 56 mmHg Mitral valve insufficiency, unspecified etiology (I34.0)  TEE 09/09/2015: MVP, posterior MV leaflet with mod myxomatous chanage. Mod MR. Echocardiogram 08/21/2014: LV size and thickness normal. LVEF 60-65%. No regional wall motion abnormalities. Mild mitral valve prolapse involving the posterior leaflet with moderate regurgitation. PASP 56 mmHg Bilateral edema of lower extremity (R60.0)  Left carotid stenosis (I65.22)  Carotid artery duplex: 07/24/2015: Stenosis in the left internal carotid artery (16-49%), upper end of spectrum. Antegrade right vertebral artery flow. Antegrade left vertebral artery flow. Follow up in one year is appropriate if clinically indicated. Paroxysmal atrial fibrillation (I48.0)  CHA2DS2-VASc Score is 4 with yearly risk of stroke of 4.0%. HAS-Bled score is 1 and estimated major bleeding in  one year is 1.02-1.5% Event Monitor 15 days 08/04/2015: Paroxysmal episodes of atrial fibrillation with controlled ventricular response, patient symptomatic with palpitations. Episodic fluttering revealed  PVC. Multiple asymptomatic atrial fibrillation also evident. Longest duration 1.5 hours. Labwork  08/27/2015: Total cholesterol 204, triglycerides 160, HDL 28, LDL 144, LDL particle #[id number] 07/07/2015: CBC normal, BNP 253, creatinine 0.95, potassium 4.8 Hypercholesteremia (E78.00)  GERD (gastroesophageal reflux disease) (K21.9)  Glaucoma (H40.9)  Heart murmur, systolic (R51.8)  Insomnia (G47.00)   Allergies (April Garrison; 11/03/15 12:45 PM) AcetaZOLAMIDE *CHEMICALS*  Rash. Cold, Numbness in Fingers & Toes Codeine/Codeine Derivatives  Itching. Lisinopril *ANTIHYPERTENSIVES*  Cough. AmLODIPine Besylate *CHEMICALS*  Fatigue  Family History (April Garrison; 2015/11/03 12:45 PM) Mother  Deceased. at a young age from Suicide Father  Unknown History Brother 1  1/2 - Younger  Social History (April Garrison; 11/03/15 12:45 PM) Current tobacco use  Former smoker. Quit in 1991 Alcohol Use  Moderate alcohol use. 1-2 before bedtime Marital status  Married. Number of Children  3. Living Situation  Lives with spouse.  Past Surgical History (April Louretta Shorten; 03-Nov-2015 12:45 PM) Lumpectomy 1974 Bilateral. Arthroscopic Knee Surgery - Both 1985 Hysterectomy (not due to cancer) - Partial 1991 TRABECULECTOMY 10/2010 Right.  Medication History (April Louretta Shorten; November 03, 2015 12:45 PM) Pradaxa ('150MG'$  Capsule, 1 Capsule Oral 2 times per day for atrial fibrillation, Taken starting 09/18/2015) Active. Zetia ('10MG'$  Tablet, 1 (one) Tablet Oral take in the evening after dinner, Taken starting 09/18/2015) Active. Atorvastatin Calcium ('40MG'$  Tablet, 1 (one) Tablet Tablet Oral daily, Taken starting 09/04/2015) Active. (Please D/C Crestor Rx!) Metoprolol Tartrate ('50MG'$  Tablet, 1 (one) Tablet  Tablet Oral two times daily, Taken starting 08/20/2015) Active. Digoxin (250MCG Tablet, 1 (one) Tablet Tablet Oral daily, Taken starting 08/20/2015) Active. Furosemide ('40MG'$  Tablet, 1 Tablet Tablet Oral daily, Taken starting 08/20/2015) Active. Amoxicillin ('500MG'$  Capsule, 4 Capsule Capsule Capsule Caps Oral one hour prior to dental or invasive procedures, Taken starting 07/09/2015) Active. Ensure Clear (1 Oral daily) Active. Valsartan ('80MG'$  Tablet, 1 Oral daily) Active. Dexilant ('60MG'$  Capsule DR, 1 Oral daily) Active. Zolpidem Tartrate ('10MG'$  Tablet, 1/2 Oral at bedtime as needed) Active. Pilocarpine HCl (4% Solution, 1 drop both eyes Ophthalmic three times daily) Active. Latanoprost (0.005% Solution, 1 drop both eyes Ophthalmic two times daily) Active. Brimonidine Tartrate (0.2% Solution, 1 drop both eyes Ophthalmic two times daily) Active. Cosopt (22.3-6.'8MG'$ /ML Solution, 1 drop both eyes Ophthalmic two times daily) Active. Ventolin HFA (108 (90 Base)MCG/ACT Aerosol Soln, 1 puff Inhalation as needed) Active.  Diagnostic Studies History (April Garrison; Nov 03, 2015 12:46 PM) Nuclear stress test 09/22/2015 1. Resting EKG demonstrated normal sinus rhythm, normal axis. No evidence of ischemia. Stress EKG is positive for myocardial ischemia. With exercise there is frequent PVCs, ventricular couplets and ventricular complaints, associated with 2 mm horizontal ST segment depression in the inferior and lateral leads. ST segment changes persisted for greater than 3 minutes into recovery. Stress terminated due to fatigue. The patient performed treadmill exercise using a Bruce protocol, completing 6 minutes. The patient completed an estimated workload of 7.05 METS. 2. Myocardial perfusion imaging is normal. Overall left ventricular systolic function was normal without regional wall motion abnormalities. The left ventricular ejection fraction was 89%. This is an intermediate risk study, clinical  correlation recommended.This is an intermediate risk study due to arrhythmias and ST changes of ischemia. Multivessel disease and balanced ischemia cannot be excluded. Consider further cardiac work-up. Colonoscopy 2012 removed benign polyps Endoscopy 2014 GERD Echocardiogram 08/21/2014 LV size and thickness normal. LVEF 60-65%. No regional wall motion abnormalities. Mild mitral valve prolapse involving the posterior leaflet with moderate regurgitation. PASP 56 mmHg Carotid Doppler 07/24/2015 Stenosis in the left internal carotid  artery (16-49%), upper end of spectrum. Antegrade right vertebral artery flow. Antegrade left vertebral artery flow. Follow up in one year is appropriate if clinically indicated. TEE 61/44/3154 Normal LV systolic function, EF 00-86%. 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 Mild to moderate enlargement of the left atrium.    Review of Systems Laverda Page MD; 10/06/2015 1:25 PM) General Present- Fatigue. Not Present- Anorexia and Fever. Respiratory Present- Decreased Exercise Tolerance, Difficulty Breathing on Exertion and Dyspnea. Not Present- Cough. Cardiovascular Not Present- Chest Pain, Claudications, Edema, Orthopnea, Palpitations and Paroxysmal Nocturnal Dyspnea. Gastrointestinal Not Present- Black, Tarry Stool, Change in Bowel Habits and Nausea. Neurological Not Present- Focal Neurological Symptoms and Syncope. Endocrine Not Present- Cold Intolerance, Excessive Sweating, Heat Intolerance and Thyroid Problems. Hematology Not Present- Anemia, Easy Bruising, Petechiae and Prolonged Bleeding.  Vitals Loletha Grayer [place]; 10/06/2015 1:01 PM) 10/06/2015 12:49 PM Weight: 130.25 lb Height: 64in Body Surface Area: 1.63 m Body Mass Index: 22.36 kg/m  Pulse: 52 (Regular)  P.OX: 94% (Room air) BP: 130/60 (Sitting, Left Arm, Standard)       Physical Exam  Laverda Page, MD; 10/06/2015 1:37 PM) General Mental Status-Alert. General Appearance-Cooperative and Appears stated age. Build & Nutrition-Lean and Petite.  Head and Neck Thyroid Gland Characteristics - normal size and consistency and no palpable nodules.  Chest and Lung Exam Chest and lung exam reveals -quiet, even and easy respiratory effort with no use of accessory muscles, non-tender and on auscultation, normal breath sounds, no adventitious sounds.  Cardiovascular Cardiovascular examination reveals -abdominal aorta auscultation reveals no bruits and no prominent pulsation, femoral artery auscultation bilaterally reveals normal pulses, no bruits, no thrills and normal pedal pulses bilaterally. Auscultation Rhythm - Regular. Heart Sounds - S1 Diminished intensity and S2 WNL. Murmurs & Other Heart Sounds: Murmur - Location - Apex. Timing - Holosystolic. Grade - III/VI. Radiation - Carotids and Heart base.  Abdomen Palpation/Percussion Normal exam - Non Tender and No hepatosplenomegaly.  Peripheral Vascular Carotid arteries - Left-Carotid bruit.  Neurologic Neurologic evaluation reveals -alert and oriented x 3 with no impairment of recent or remote memory. Motor-Grossly intact without any focal deficits.  Musculoskeletal Global Assessment Left Lower Extremity - no deformities, masses or tenderness, no known fractures. Right Lower Extremity - no deformities, masses or tenderness, no known fractures.    Assessment & Plan Laverda Page MD; 10/06/2015 1:25 PM) Paroxysmal atrial fibrillation (I48.0) Story: CHA2DS2-VASc Score is 4 with yearly risk of stroke of 4.0%. HAS-Bled score is 1 and estimated major bleeding in one year is 1.02-1.5%  Event Monitor 15 days 08/04/2015: Paroxysmal episodes of atrial fibrillation with controlled ventricular response, patient symptomatic with palpitations. Episodic fluttering revealed PVC. Multiple asymptomatic atrial  fibrillation also evident. Longest duration 1.5 hours. Impression: EKG 09/18/2015: sinus rhythm with borderline first-degree AV block at the rate of 63 bpm, normal axis, sagging ST segment depression in the lateral leads, probably digitalis effect. Low-voltage complexes. Compared to EKG 08/04/2015, ST sagging new.  EKG 07/28/2015: Atrial fibrillation versus atypical flutter with RVR with ventricular rates of 140-150. Normal axis. Poor R-wave progression, low voltage complexes.  Shortness of breath (R06.02) Story: Exercise myoview stress 09/22/2015: 1. Resting EKG demonstrated normal sinus rhythm, normal axis. No evidence of ischemia. Stress EKG is positive for myocardial ischemia. With exercise there is frequent PVCs, ventricular couplets and ventricular complaints, associated with 2 mm horizontal ST segment depression in the inferior and lateral leads. ST segment changes persisted  for greater than 3 minutes into recovery. Stress terminated due to fatigue. The patient performed treadmill exercise using a Bruce protocol, completing 6 minutes. The patient completed an estimated workload of 7.05 METS. 2. Myocardial perfusion imaging is normal. Overall left ventricular systolic function was normal without regional wall motion abnormalities. The left ventricular ejection fraction was 89%. This is an intermediate risk study, clinical correlation recommended.This is an intermediate risk study due to arrhythmias and ST changes of ischemia. Multivessel disease and balanced ischemia cannot be excluded. Consider further cardiac work-up. Impression: EKG 07/09/2015: Sinus rhythm at a rate of 74 bpm, normal axis, left atrial enlargement, no evidence of ischemia. Mitral valve insufficiency, unspecified etiology (I34.0) Story: TEE 09/09/2015: MVP, posterior MV leaflet with mod myxomatous chanage. Mod MR.  Echocardiogram 08/21/2014: LV size and thickness normal. LVEF 60-65%. No regional wall motion abnormalities. Mild  mitral valve prolapse involving the posterior leaflet with moderate regurgitation. PASP 56 mmHg Left carotid stenosis (G31.51) Story: Carotid artery duplex: 07/24/2015: Stenosis in the left internal carotid artery (16-49%), upper end of spectrum. Antegrade right vertebral artery flow. Antegrade left vertebral artery flow. Follow up in one year is appropriate if clinically indicated. Future Plans 07/27/2016: Complete duplex scan of both carotid arteries (76160) - one time Labwork Story: 08/27/2015: Total cholesterol 204, triglycerides 160, HDL 28, LDL 144, LDL particle #[id number]  07/07/2015: CBC normal, BNP 253, creatinine 0.95, potassium 4.8   Current Plans Mechanism of underlying disease process and action of medications discussed with the patient. I discussed primary/secondary prevention and also dietary counseling was done. Patient contused report severe dyspnea on exertion, marked fatigue. I have reviewed the results of the stress test and again reviewed the results of the echocardiogram the patient, physical examination is consistent with severe mitral regurgitation although both TEE and he TEE revealed moderate to most moderately severe MR. Due to clinical suspicion of severe MR, continued severe symptoms of dyspnea with marked limitation in physical activity, abnormal stress EKG although perfusion is normal, I have recommended that we proceed with right and left heart catheterization to evaluate coronary anatomy and also to reevaluate mitral regurgitation angiographically.  Schedule for cardiac catheterization, and possible angioplasty. We discussed regarding risks, benefits, alternatives to this including stress testing, CTA and continued medical therapy. Patient wants to proceed. Understands <1-2% risk of death, stroke, MI, urgent CABG, bleeding, infection, renal failure but not limited to these.  CC Bing Matter, PA-C  Addendum Note(Bridgette Ebony Hail AGNP-C; 10/14/2015 8:30 AM) 10/10/2015:  Creatinine 1.07, eGFR 54, potassium 4.4, CBC normal, PT/INR normal  Labs stable for coronary angiogram.  Signed by Laverda Page, MD (10/06/2015 1:38 pm)

## 2015-10-16 ENCOUNTER — Ambulatory Visit (HOSPITAL_COMMUNITY)
Admission: RE | Admit: 2015-10-16 | Discharge: 2015-10-16 | Disposition: A | Discharge status: Home / Self Care | Payer: Medicare Other | Source: Ambulatory Visit | Attending: Cardiology | Admitting: Cardiology

## 2015-10-16 ENCOUNTER — Encounter (HOSPITAL_COMMUNITY): Payer: Self-pay | Admitting: Cardiology

## 2015-10-16 ENCOUNTER — Encounter (HOSPITAL_COMMUNITY)
Admission: RE | Disposition: A | Discharge status: Home / Self Care | Payer: Self-pay | Source: Ambulatory Visit | Attending: Cardiology

## 2015-10-16 DIAGNOSIS — I480 Paroxysmal atrial fibrillation: Secondary | ICD-10-CM | POA: Insufficient documentation

## 2015-10-16 DIAGNOSIS — R0600 Dyspnea, unspecified: Secondary | ICD-10-CM | POA: Diagnosis present

## 2015-10-16 DIAGNOSIS — E7800 Pure hypercholesterolemia, unspecified: Secondary | ICD-10-CM | POA: Insufficient documentation

## 2015-10-16 DIAGNOSIS — I10 Essential (primary) hypertension: Secondary | ICD-10-CM | POA: Diagnosis not present

## 2015-10-16 DIAGNOSIS — Z7901 Long term (current) use of anticoagulants: Secondary | ICD-10-CM | POA: Diagnosis not present

## 2015-10-16 DIAGNOSIS — R0609 Other forms of dyspnea: Secondary | ICD-10-CM | POA: Insufficient documentation

## 2015-10-16 DIAGNOSIS — I2584 Coronary atherosclerosis due to calcified coronary lesion: Secondary | ICD-10-CM | POA: Insufficient documentation

## 2015-10-16 DIAGNOSIS — Z79899 Other long term (current) drug therapy: Secondary | ICD-10-CM | POA: Diagnosis not present

## 2015-10-16 DIAGNOSIS — I340 Nonrheumatic mitral (valve) insufficiency: Secondary | ICD-10-CM | POA: Insufficient documentation

## 2015-10-16 DIAGNOSIS — H409 Unspecified glaucoma: Secondary | ICD-10-CM | POA: Diagnosis not present

## 2015-10-16 DIAGNOSIS — K219 Gastro-esophageal reflux disease without esophagitis: Secondary | ICD-10-CM | POA: Insufficient documentation

## 2015-10-16 DIAGNOSIS — Z87891 Personal history of nicotine dependence: Secondary | ICD-10-CM | POA: Insufficient documentation

## 2015-10-16 DIAGNOSIS — G4700 Insomnia, unspecified: Secondary | ICD-10-CM | POA: Insufficient documentation

## 2015-10-16 HISTORY — PX: PERIPHERAL VASCULAR CATHETERIZATION: SHX172C

## 2015-10-16 HISTORY — PX: CARDIAC CATHETERIZATION: SHX172

## 2015-10-16 LAB — POCT I-STAT 3, VENOUS BLOOD GAS (G3P V)
Acid-base deficit: 2 mmol/L (ref 0.0–2.0)
Acid-base deficit: 2 mmol/L (ref 0.0–2.0)
Bicarbonate: 24.2 mEq/L — ABNORMAL HIGH (ref 20.0–24.0)
Bicarbonate: 24.4 mEq/L — ABNORMAL HIGH (ref 20.0–24.0)
O2 Saturation: 64 %
O2 Saturation: 64 %
TCO2: 26 mmol/L (ref 0–100)
TCO2: 26 mmol/L (ref 0–100)
pCO2, Ven: 47.7 mmHg (ref 45.0–50.0)
pCO2, Ven: 47.9 mmHg (ref 45.0–50.0)
pH, Ven: 7.314 — ABNORMAL HIGH (ref 7.250–7.300)
pH, Ven: 7.316 — ABNORMAL HIGH (ref 7.250–7.300)
pO2, Ven: 36 mmHg (ref 31.0–45.0)
pO2, Ven: 36 mmHg (ref 31.0–45.0)

## 2015-10-16 LAB — POCT I-STAT 3, ART BLOOD GAS (G3+)
Acid-base deficit: 3 mmol/L — ABNORMAL HIGH (ref 0.0–2.0)
Bicarbonate: 23.5 mEq/L (ref 20.0–24.0)
O2 Saturation: 91 %
TCO2: 25 mmol/L (ref 0–100)
pCO2 arterial: 45.5 mmHg — ABNORMAL HIGH (ref 35.0–45.0)
pH, Arterial: 7.321 — ABNORMAL LOW (ref 7.350–7.450)
pO2, Arterial: 67 mmHg — ABNORMAL LOW (ref 80.0–100.0)

## 2015-10-16 SURGERY — RIGHT/LEFT HEART CATH AND CORONARY ANGIOGRAPHY

## 2015-10-16 MED ORDER — ASPIRIN 81 MG PO CHEW
81.0000 mg | CHEWABLE_TABLET | ORAL | Status: AC
  Administered 2015-10-16: 81 mg via ORAL

## 2015-10-16 MED ORDER — IOPAMIDOL (ISOVUE-370) INJECTION 76%
INTRAVENOUS | Status: AC
  Filled 2015-10-16: qty 50

## 2015-10-16 MED ORDER — SODIUM CHLORIDE 0.9 % IV SOLN: 250.0000 mL | INTRAVENOUS | Status: DC | PRN

## 2015-10-16 MED ORDER — HEPARIN SODIUM (PORCINE) 1000 UNIT/ML IJ SOLN
INTRAMUSCULAR | Status: DC | PRN
  Administered 2015-10-16: 3000 [IU] via INTRAVENOUS

## 2015-10-16 MED ORDER — VERAPAMIL HCL 2.5 MG/ML IV SOLN
INTRA_ARTERIAL | Status: DC | PRN
  Administered 2015-10-16: 08:00:00 via INTRA_ARTERIAL

## 2015-10-16 MED ORDER — SODIUM CHLORIDE 0.9 % WEIGHT BASED INFUSION
3.0000 mL/kg/h | INTRAVENOUS | Status: AC
  Administered 2015-10-16: 3 mL/kg/h via INTRAVENOUS

## 2015-10-16 MED ORDER — IOPAMIDOL (ISOVUE-370) INJECTION 76%
INTRAVENOUS | Status: AC
  Filled 2015-10-16: qty 100

## 2015-10-16 MED ORDER — NITROGLYCERIN 1 MG/10 ML FOR IR/CATH LAB
INTRA_ARTERIAL | Status: AC
  Filled 2015-10-16: qty 10

## 2015-10-16 MED ORDER — SODIUM CHLORIDE 0.9% FLUSH: 3.0000 mL | INTRAVENOUS | Status: DC | PRN

## 2015-10-16 MED ORDER — SODIUM CHLORIDE 0.9% FLUSH: 3.0000 mL | Freq: Two times a day (BID) | INTRAVENOUS | Status: DC

## 2015-10-16 MED ORDER — MIDAZOLAM HCL 2 MG/2ML IJ SOLN
INTRAMUSCULAR | Status: DC | PRN
  Administered 2015-10-16: 1 mg via INTRAVENOUS

## 2015-10-16 MED ORDER — HEPARIN SODIUM (PORCINE) 1000 UNIT/ML IJ SOLN
INTRAMUSCULAR | Status: AC
  Filled 2015-10-16: qty 1

## 2015-10-16 MED ORDER — VERAPAMIL HCL 2.5 MG/ML IV SOLN
INTRAVENOUS | Status: AC
  Filled 2015-10-16: qty 2

## 2015-10-16 MED ORDER — SODIUM CHLORIDE 0.9 % WEIGHT BASED INFUSION: 1.0000 mL/kg/h | INTRAVENOUS | Status: DC

## 2015-10-16 MED ORDER — SODIUM CHLORIDE 0.9 % IV SOLN
INTRAVENOUS | Status: DC | PRN
  Administered 2015-10-16: 3 mL/kg/h via INTRAVENOUS

## 2015-10-16 MED ORDER — HEPARIN (PORCINE) IN NACL 2-0.9 UNIT/ML-% IJ SOLN
INTRAMUSCULAR | Status: AC
  Filled 2015-10-16: qty 1000

## 2015-10-16 MED ORDER — ASPIRIN 81 MG PO CHEW
CHEWABLE_TABLET | ORAL | Status: AC
  Filled 2015-10-16: qty 1

## 2015-10-16 MED ORDER — LIDOCAINE HCL (PF) 1 % IJ SOLN
INTRAMUSCULAR | Status: AC
  Filled 2015-10-16: qty 30

## 2015-10-16 MED ORDER — LIDOCAINE HCL (PF) 1 % IJ SOLN
INTRAMUSCULAR | Status: DC | PRN
  Administered 2015-10-16: 2 mL
  Administered 2015-10-16: 1 mL

## 2015-10-16 MED ORDER — FENTANYL CITRATE (PF) 100 MCG/2ML IJ SOLN
INTRAMUSCULAR | Status: AC
  Filled 2015-10-16: qty 2

## 2015-10-16 MED ORDER — FENTANYL CITRATE (PF) 100 MCG/2ML IJ SOLN
INTRAMUSCULAR | Status: DC | PRN
  Administered 2015-10-16: 25 ug via INTRAVENOUS

## 2015-10-16 MED ORDER — HEPARIN (PORCINE) IN NACL 2-0.9 UNIT/ML-% IJ SOLN
INTRAMUSCULAR | Status: DC | PRN
  Administered 2015-10-16: 1000 mL

## 2015-10-16 MED ORDER — SODIUM CHLORIDE 0.9 % WEIGHT BASED INFUSION
1.0000 mL/kg/h | INTRAVENOUS | Status: AC
Start: 2015-10-16 — End: 2015-10-16

## 2015-10-16 MED ORDER — IOPAMIDOL (ISOVUE-370) INJECTION 76%
INTRAVENOUS | Status: DC | PRN
  Administered 2015-10-16: 127 mL via INTRAVENOUS

## 2015-10-16 MED ORDER — MIDAZOLAM HCL 2 MG/2ML IJ SOLN
INTRAMUSCULAR | Status: AC
  Filled 2015-10-16: qty 2

## 2015-10-16 SURGICAL SUPPLY — 13 items
CATH BALLN WEDGE 5F 110CM (CATHETERS) ×1 IMPLANT
CATH INFINITI 5FR ANG PIGTAIL (CATHETERS) ×1 IMPLANT
CATH OPTITORQUE TIG 4.0 5F (CATHETERS) ×1 IMPLANT
DEVICE RAD COMP TR BAND LRG (VASCULAR PRODUCTS) ×1 IMPLANT
GLIDESHEATH SLEND A-KIT 6F 20G (SHEATH) ×1 IMPLANT
GUIDEWIRE ANGLED .035X150CM (WIRE) ×1 IMPLANT
KIT HEART LEFT (KITS) ×3 IMPLANT
PACK CARDIAC CATHETERIZATION (CUSTOM PROCEDURE TRAY) ×3 IMPLANT
SHEATH FAST CATH BRACH 5F 5CM (SHEATH) ×1 IMPLANT
SYR MEDRAD MARK V 150ML (SYRINGE) ×1 IMPLANT
TRANSDUCER W/STOPCOCK (MISCELLANEOUS) ×5 IMPLANT
TUBING CIL FLEX 10 FLL-RA (TUBING) ×3 IMPLANT
WIRE SAFE-T 1.5MM-J .035X260CM (WIRE) ×1 IMPLANT

## 2015-10-16 NOTE — Discharge Instructions (Signed)
Radial Site Care °Refer to this sheet in the next few weeks. These instructions provide you with information about caring for yourself after your procedure. Your health care provider may also give you more specific instructions. Your treatment has been planned according to current medical practices, but problems sometimes occur. Call your health care provider if you have any problems or questions after your procedure. °WHAT TO EXPECT AFTER THE PROCEDURE °After your procedure, it is typical to have the following: °· Bruising at the radial site that usually fades within 1-2 weeks. °· Blood collecting in the tissue (hematoma) that may be painful to the touch. It should usually decrease in size and tenderness within 1-2 weeks. °HOME CARE INSTRUCTIONS °· Take medicines only as directed by your health care provider. °· You may shower 24-48 hours after the procedure or as directed by your health care provider. Remove the bandage (dressing) and gently wash the site with plain soap and water. Pat the area dry with a clean towel. Do not rub the site, because this may cause bleeding. °· Do not take baths, swim, or use a hot tub until your health care provider approves. °· Check your insertion site every day for redness, swelling, or drainage. °· Do not apply powder or lotion to the site. °· Do not flex or bend the affected arm for 24 hours or as directed by your health care provider. °· Do not push or pull heavy objects with the affected arm for 24 hours or as directed by your health care provider. °· Do not lift over 10 lb (4.5 kg) for 5 days after your procedure or as directed by your health care provider. °· Ask your health care provider when it is okay to: °¨ Return to work or school. °¨ Resume usual physical activities or sports. °¨ Resume sexual activity. °· Do not drive home if you are discharged the same day as the procedure. Have someone else drive you. °· You may drive 24 hours after the procedure unless otherwise  instructed by your health care provider. °· Do not operate machinery or power tools for 24 hours after the procedure. °· If your procedure was done as an outpatient procedure, which means that you went home the same day as your procedure, a responsible adult should be with you for the first 24 hours after you arrive home. °· Keep all follow-up visits as directed by your health care provider. This is important. °SEEK MEDICAL CARE IF: °· You have a fever. °· You have chills. °· You have increased bleeding from the radial site. Hold pressure on the site. °SEEK IMMEDIATE MEDICAL CARE IF: °· You have unusual pain at the radial site. °· You have redness, warmth, or swelling at the radial site. °· You have drainage (other than a small amount of blood on the dressing) from the radial site. °· The radial site is bleeding, and the bleeding does not stop after 30 minutes of holding steady pressure on the site. °· Your arm or hand becomes pale, cool, tingly, or numb. °  °This information is not intended to replace advice given to you by your health care provider. Make sure you discuss any questions you have with your health care provider. °  °Document Released: 06/12/2010 Document Revised: 05/31/2014 Document Reviewed: 11/26/2013 °Elsevier Interactive Patient Education ©2016 [employer] ° °

## 2015-10-16 NOTE — Progress Notes (Signed)
TRB removed and occlusive dressing applied.  D/C instructions reviewed with pt.  Understanding voiced.  Will monitor for 30 minutes prior to D/C

## 2015-10-16 NOTE — Interval H&P Note (Signed)
History and Physical Interval Note:  10/16/2015 6:34 AM  Laura Knapp  has presented today for surgery, with the diagnosis of shortness of breath  The various methods of treatment have been discussed with the patient and family. After consideration of risks, benefits and other options for treatment, the patient has consented to  Procedure(s): Right/Left Heart Cath and Coronary Angiography (N/A) and possible PTCA as a surgical intervention .  The patient's history has been reviewed, patient examined, no change in status, stable for surgery.  I have reviewed the patient's chart and labs.  Questions were answered to the patient's satisfaction.   Ischemic Symptoms? CCS III (Marked limitation of ordinary activity) Anti-ischemic Medical Therapy? Minimal Therapy (1 class of medications) Non-invasive Test Results? Intermediate-risk stress test findings: cardiac mortality 1-3%/year Prior CABG? No Previous CABG   Patient Information:   1-2V CAD, no prox LAD  U (6)  Indication: 16; Score: 6   Patient Information:   CTO of 1 vessel, no other CAD  U (6)  Indication: 26; Score: 6   Patient Information:   1V CAD with prox LAD  A (7)  Indication: 32; Score: 7   Patient Information:   2V-CAD with prox LAD  A (8)  Indication: 38; Score: 8   Patient Information:   3V-CAD without LMCA  A (8)  Indication: 44; Score: 8   Patient Information:   3V-CAD without LMCA With Abnormal LV systolic function  A (9)  Indication: 48; Score: 9   Patient Information:   LMCA-CAD  A (9)  Indication: 49; Score: 9   Patient Information:   2V-CAD with prox LAD PCI  A (7)  Indication: 62; Score: 7   Patient Information:   2V-CAD with prox LAD CABG  A (8)  Indication: 62; Score: 8   Patient Information:   3V-CAD without LMCA With Low CAD burden(i.e., 3 focal stenoses, low SYNTAX score) PCI  A (7)  Indication: 63; Score: 7   Patient Information:   3V-CAD without  LMCA With Low CAD burden(i.e., 3 focal stenoses, low SYNTAX score) CABG  A (9)  Indication: 63; Score: 9   Patient Information:   3V-CAD without LMCA E06c - Intermediate-high CAD burden (i.e., multiple diffuse lesions, presence of CTO, or high SYNTAX score) PCI  U (4)  Indication: 64; Score: 4   Patient Information:   3V-CAD without LMCA E06c - Intermediate-high CAD burden (i.e., multiple diffuse lesions, presence of CTO, or high SYNTAX score) CABG  A (9)  Indication: 64; Score: 9   Patient Information:   LMCA-CAD With Isolated LMCA stenosis  PCI  U (6)  Indication: 65; Score: 6   Patient Information:   LMCA-CAD Additional CAD, low CAD burden (i.e., 1- to 2-vessel additional involvement, low SYNTAX score) PCI  U (5)  Indication: 66; Score: 5   Patient Information:   LMCA-CAD Additional CAD, low CAD burden (i.e., 1- to 2-vessel additional involvement, low SYNTAX score) CABG  A (9)  Indication: 66; Score: 9   Patient Information:   LMCA-CAD With Isolated LMCA stenosis  CABG  A (9)  Indication: 66; Score: 9   Patient Information:   LMCA-CAD Additional CAD, intermediate-high CAD burden (i.e., 3-vessel involvement, presence of CTO, or high SYNTAX score) PCI  I (3)  Indication: 67; Score: 3   Patient Information:   LMCA-CAD Additional CAD, intermediate-high CAD burden (i.e., 3-vessel involvement, presence of CTO, or high SYNTAX score) CABG  A (9)  Indication: 67; Score: 9  Adrian Prows

## 2015-10-23 DIAGNOSIS — I340 Nonrheumatic mitral (valve) insufficiency: Secondary | ICD-10-CM | POA: Diagnosis not present

## 2015-10-23 DIAGNOSIS — I480 Paroxysmal atrial fibrillation: Secondary | ICD-10-CM | POA: Diagnosis not present

## 2015-10-23 DIAGNOSIS — I6522 Occlusion and stenosis of left carotid artery: Secondary | ICD-10-CM | POA: Diagnosis not present

## 2015-10-23 DIAGNOSIS — R0602 Shortness of breath: Secondary | ICD-10-CM | POA: Diagnosis not present

## 2015-11-10 DIAGNOSIS — H401133 Primary open-angle glaucoma, bilateral, severe stage: Secondary | ICD-10-CM | POA: Diagnosis not present

## 2015-11-11 DIAGNOSIS — R05 Cough: Secondary | ICD-10-CM | POA: Diagnosis not present

## 2015-11-11 DIAGNOSIS — I340 Nonrheumatic mitral (valve) insufficiency: Secondary | ICD-10-CM | POA: Diagnosis not present

## 2015-11-13 DIAGNOSIS — I4891 Unspecified atrial fibrillation: Secondary | ICD-10-CM | POA: Diagnosis not present

## 2015-11-13 DIAGNOSIS — I340 Nonrheumatic mitral (valve) insufficiency: Secondary | ICD-10-CM | POA: Diagnosis not present

## 2015-11-24 DIAGNOSIS — Z01818 Encounter for other preprocedural examination: Secondary | ICD-10-CM | POA: Diagnosis not present

## 2015-11-24 DIAGNOSIS — Z01811 Encounter for preprocedural respiratory examination: Secondary | ICD-10-CM | POA: Diagnosis not present

## 2015-11-24 DIAGNOSIS — Z136 Encounter for screening for cardiovascular disorders: Secondary | ICD-10-CM | POA: Diagnosis not present

## 2015-11-26 DIAGNOSIS — J9589 Other postprocedural complications and disorders of respiratory system, not elsewhere classified: Secondary | ICD-10-CM | POA: Diagnosis not present

## 2015-11-26 DIAGNOSIS — I4891 Unspecified atrial fibrillation: Secondary | ICD-10-CM | POA: Diagnosis not present

## 2015-11-26 DIAGNOSIS — Z87891 Personal history of nicotine dependence: Secondary | ICD-10-CM | POA: Diagnosis not present

## 2015-11-26 DIAGNOSIS — R739 Hyperglycemia, unspecified: Secondary | ICD-10-CM | POA: Diagnosis present

## 2015-11-26 DIAGNOSIS — H9193 Unspecified hearing loss, bilateral: Secondary | ICD-10-CM | POA: Diagnosis present

## 2015-11-26 DIAGNOSIS — I340 Nonrheumatic mitral (valve) insufficiency: Secondary | ICD-10-CM | POA: Diagnosis not present

## 2015-11-26 DIAGNOSIS — Z9889 Other specified postprocedural states: Secondary | ICD-10-CM | POA: Diagnosis not present

## 2015-11-26 DIAGNOSIS — R0989 Other specified symptoms and signs involving the circulatory and respiratory systems: Secondary | ICD-10-CM | POA: Diagnosis not present

## 2015-11-26 DIAGNOSIS — J90 Pleural effusion, not elsewhere classified: Secondary | ICD-10-CM | POA: Diagnosis not present

## 2015-11-26 DIAGNOSIS — I482 Chronic atrial fibrillation: Secondary | ICD-10-CM | POA: Diagnosis not present

## 2015-11-26 DIAGNOSIS — E785 Hyperlipidemia, unspecified: Secondary | ICD-10-CM | POA: Diagnosis not present

## 2015-11-26 DIAGNOSIS — I10 Essential (primary) hypertension: Secondary | ICD-10-CM | POA: Diagnosis not present

## 2015-11-26 DIAGNOSIS — H409 Unspecified glaucoma: Secondary | ICD-10-CM | POA: Diagnosis present

## 2015-11-26 DIAGNOSIS — E872 Acidosis: Secondary | ICD-10-CM | POA: Diagnosis not present

## 2015-11-26 DIAGNOSIS — R579 Shock, unspecified: Secondary | ICD-10-CM | POA: Diagnosis not present

## 2015-11-26 DIAGNOSIS — J9811 Atelectasis: Secondary | ICD-10-CM | POA: Diagnosis not present

## 2015-11-26 DIAGNOSIS — R918 Other nonspecific abnormal finding of lung field: Secondary | ICD-10-CM | POA: Diagnosis not present

## 2015-11-26 DIAGNOSIS — R34 Anuria and oliguria: Secondary | ICD-10-CM | POA: Diagnosis not present

## 2015-11-26 DIAGNOSIS — K219 Gastro-esophageal reflux disease without esophagitis: Secondary | ICD-10-CM | POA: Diagnosis not present

## 2015-11-26 DIAGNOSIS — D688 Other specified coagulation defects: Secondary | ICD-10-CM | POA: Diagnosis not present

## 2015-11-26 HISTORY — PX: MITRAL VALVE REPAIR: SHX2039

## 2015-12-03 DIAGNOSIS — I4891 Unspecified atrial fibrillation: Secondary | ICD-10-CM | POA: Diagnosis not present

## 2015-12-03 DIAGNOSIS — K219 Gastro-esophageal reflux disease without esophagitis: Secondary | ICD-10-CM | POA: Diagnosis not present

## 2015-12-03 DIAGNOSIS — Z5181 Encounter for therapeutic drug level monitoring: Secondary | ICD-10-CM | POA: Diagnosis not present

## 2015-12-03 DIAGNOSIS — Z952 Presence of prosthetic heart valve: Secondary | ICD-10-CM | POA: Diagnosis not present

## 2015-12-03 DIAGNOSIS — I10 Essential (primary) hypertension: Secondary | ICD-10-CM | POA: Diagnosis not present

## 2015-12-03 DIAGNOSIS — Z7901 Long term (current) use of anticoagulants: Secondary | ICD-10-CM | POA: Diagnosis not present

## 2015-12-03 DIAGNOSIS — Z48812 Encounter for surgical aftercare following surgery on the circulatory system: Secondary | ICD-10-CM | POA: Diagnosis not present

## 2015-12-03 DIAGNOSIS — E785 Hyperlipidemia, unspecified: Secondary | ICD-10-CM | POA: Diagnosis not present

## 2015-12-04 DIAGNOSIS — K219 Gastro-esophageal reflux disease without esophagitis: Secondary | ICD-10-CM | POA: Diagnosis not present

## 2015-12-04 DIAGNOSIS — I10 Essential (primary) hypertension: Secondary | ICD-10-CM | POA: Diagnosis not present

## 2015-12-04 DIAGNOSIS — I4891 Unspecified atrial fibrillation: Secondary | ICD-10-CM | POA: Diagnosis not present

## 2015-12-04 DIAGNOSIS — Z48812 Encounter for surgical aftercare following surgery on the circulatory system: Secondary | ICD-10-CM | POA: Diagnosis not present

## 2015-12-04 DIAGNOSIS — Z7901 Long term (current) use of anticoagulants: Secondary | ICD-10-CM | POA: Diagnosis not present

## 2015-12-04 DIAGNOSIS — E785 Hyperlipidemia, unspecified: Secondary | ICD-10-CM | POA: Diagnosis not present

## 2015-12-05 DIAGNOSIS — I4891 Unspecified atrial fibrillation: Secondary | ICD-10-CM | POA: Diagnosis not present

## 2015-12-05 DIAGNOSIS — E785 Hyperlipidemia, unspecified: Secondary | ICD-10-CM | POA: Diagnosis not present

## 2015-12-05 DIAGNOSIS — I10 Essential (primary) hypertension: Secondary | ICD-10-CM | POA: Diagnosis not present

## 2015-12-05 DIAGNOSIS — K219 Gastro-esophageal reflux disease without esophagitis: Secondary | ICD-10-CM | POA: Diagnosis not present

## 2015-12-05 DIAGNOSIS — Z48812 Encounter for surgical aftercare following surgery on the circulatory system: Secondary | ICD-10-CM | POA: Diagnosis not present

## 2015-12-05 DIAGNOSIS — Z7901 Long term (current) use of anticoagulants: Secondary | ICD-10-CM | POA: Diagnosis not present

## 2015-12-08 DIAGNOSIS — E785 Hyperlipidemia, unspecified: Secondary | ICD-10-CM | POA: Diagnosis not present

## 2015-12-08 DIAGNOSIS — Z48812 Encounter for surgical aftercare following surgery on the circulatory system: Secondary | ICD-10-CM | POA: Diagnosis not present

## 2015-12-08 DIAGNOSIS — Z7901 Long term (current) use of anticoagulants: Secondary | ICD-10-CM | POA: Diagnosis not present

## 2015-12-08 DIAGNOSIS — I10 Essential (primary) hypertension: Secondary | ICD-10-CM | POA: Diagnosis not present

## 2015-12-08 DIAGNOSIS — I4891 Unspecified atrial fibrillation: Secondary | ICD-10-CM | POA: Diagnosis not present

## 2015-12-08 DIAGNOSIS — K219 Gastro-esophageal reflux disease without esophagitis: Secondary | ICD-10-CM | POA: Diagnosis not present

## 2015-12-10 DIAGNOSIS — I10 Essential (primary) hypertension: Secondary | ICD-10-CM | POA: Diagnosis not present

## 2015-12-10 DIAGNOSIS — Z48812 Encounter for surgical aftercare following surgery on the circulatory system: Secondary | ICD-10-CM | POA: Diagnosis not present

## 2015-12-10 DIAGNOSIS — E785 Hyperlipidemia, unspecified: Secondary | ICD-10-CM | POA: Diagnosis not present

## 2015-12-10 DIAGNOSIS — I4891 Unspecified atrial fibrillation: Secondary | ICD-10-CM | POA: Diagnosis not present

## 2015-12-10 DIAGNOSIS — Z7901 Long term (current) use of anticoagulants: Secondary | ICD-10-CM | POA: Diagnosis not present

## 2015-12-10 DIAGNOSIS — K219 Gastro-esophageal reflux disease without esophagitis: Secondary | ICD-10-CM | POA: Diagnosis not present

## 2015-12-11 DIAGNOSIS — Z7901 Long term (current) use of anticoagulants: Secondary | ICD-10-CM | POA: Diagnosis not present

## 2015-12-11 DIAGNOSIS — Z48812 Encounter for surgical aftercare following surgery on the circulatory system: Secondary | ICD-10-CM | POA: Diagnosis not present

## 2015-12-11 DIAGNOSIS — E785 Hyperlipidemia, unspecified: Secondary | ICD-10-CM | POA: Diagnosis not present

## 2015-12-11 DIAGNOSIS — I4891 Unspecified atrial fibrillation: Secondary | ICD-10-CM | POA: Diagnosis not present

## 2015-12-11 DIAGNOSIS — K219 Gastro-esophageal reflux disease without esophagitis: Secondary | ICD-10-CM | POA: Diagnosis not present

## 2015-12-11 DIAGNOSIS — I10 Essential (primary) hypertension: Secondary | ICD-10-CM | POA: Diagnosis not present

## 2015-12-12 DIAGNOSIS — I340 Nonrheumatic mitral (valve) insufficiency: Secondary | ICD-10-CM | POA: Diagnosis not present

## 2015-12-12 DIAGNOSIS — I480 Paroxysmal atrial fibrillation: Secondary | ICD-10-CM | POA: Diagnosis not present

## 2015-12-12 DIAGNOSIS — Z7901 Long term (current) use of anticoagulants: Secondary | ICD-10-CM | POA: Diagnosis not present

## 2015-12-12 DIAGNOSIS — R0602 Shortness of breath: Secondary | ICD-10-CM | POA: Diagnosis not present

## 2015-12-16 DIAGNOSIS — I10 Essential (primary) hypertension: Secondary | ICD-10-CM | POA: Diagnosis not present

## 2015-12-16 DIAGNOSIS — E785 Hyperlipidemia, unspecified: Secondary | ICD-10-CM | POA: Diagnosis not present

## 2015-12-16 DIAGNOSIS — K219 Gastro-esophageal reflux disease without esophagitis: Secondary | ICD-10-CM | POA: Diagnosis not present

## 2015-12-16 DIAGNOSIS — Z7901 Long term (current) use of anticoagulants: Secondary | ICD-10-CM | POA: Diagnosis not present

## 2015-12-16 DIAGNOSIS — I4891 Unspecified atrial fibrillation: Secondary | ICD-10-CM | POA: Diagnosis not present

## 2015-12-16 DIAGNOSIS — Z48812 Encounter for surgical aftercare following surgery on the circulatory system: Secondary | ICD-10-CM | POA: Diagnosis not present

## 2015-12-18 DIAGNOSIS — Z7901 Long term (current) use of anticoagulants: Secondary | ICD-10-CM | POA: Diagnosis not present

## 2015-12-18 DIAGNOSIS — I10 Essential (primary) hypertension: Secondary | ICD-10-CM | POA: Diagnosis not present

## 2015-12-18 DIAGNOSIS — K219 Gastro-esophageal reflux disease without esophagitis: Secondary | ICD-10-CM | POA: Diagnosis not present

## 2015-12-18 DIAGNOSIS — I4891 Unspecified atrial fibrillation: Secondary | ICD-10-CM | POA: Diagnosis not present

## 2015-12-18 DIAGNOSIS — Z48812 Encounter for surgical aftercare following surgery on the circulatory system: Secondary | ICD-10-CM | POA: Diagnosis not present

## 2015-12-18 DIAGNOSIS — E785 Hyperlipidemia, unspecified: Secondary | ICD-10-CM | POA: Diagnosis not present

## 2015-12-19 DIAGNOSIS — K219 Gastro-esophageal reflux disease without esophagitis: Secondary | ICD-10-CM | POA: Diagnosis not present

## 2015-12-19 DIAGNOSIS — E785 Hyperlipidemia, unspecified: Secondary | ICD-10-CM | POA: Diagnosis not present

## 2015-12-19 DIAGNOSIS — Z7901 Long term (current) use of anticoagulants: Secondary | ICD-10-CM | POA: Diagnosis not present

## 2015-12-19 DIAGNOSIS — I10 Essential (primary) hypertension: Secondary | ICD-10-CM | POA: Diagnosis not present

## 2015-12-19 DIAGNOSIS — Z48812 Encounter for surgical aftercare following surgery on the circulatory system: Secondary | ICD-10-CM | POA: Diagnosis not present

## 2015-12-19 DIAGNOSIS — I4891 Unspecified atrial fibrillation: Secondary | ICD-10-CM | POA: Diagnosis not present

## 2015-12-23 DIAGNOSIS — B351 Tinea unguium: Secondary | ICD-10-CM | POA: Diagnosis not present

## 2015-12-23 DIAGNOSIS — M79676 Pain in unspecified toe(s): Secondary | ICD-10-CM | POA: Diagnosis not present

## 2015-12-25 DIAGNOSIS — I340 Nonrheumatic mitral (valve) insufficiency: Secondary | ICD-10-CM | POA: Diagnosis not present

## 2015-12-26 ENCOUNTER — Other Ambulatory Visit (HOSPITAL_COMMUNITY)
Admission: RE | Admit: 2015-12-26 | Discharge: 2015-12-26 | Disposition: A | Discharge status: Home / Self Care | Payer: Medicare Other | Source: Other Acute Inpatient Hospital | Attending: Cardiology | Admitting: Cardiology

## 2015-12-26 DIAGNOSIS — Z48812 Encounter for surgical aftercare following surgery on the circulatory system: Secondary | ICD-10-CM | POA: Diagnosis not present

## 2015-12-26 DIAGNOSIS — E785 Hyperlipidemia, unspecified: Secondary | ICD-10-CM | POA: Diagnosis not present

## 2015-12-26 DIAGNOSIS — I10 Essential (primary) hypertension: Secondary | ICD-10-CM | POA: Diagnosis not present

## 2015-12-26 DIAGNOSIS — K219 Gastro-esophageal reflux disease without esophagitis: Secondary | ICD-10-CM | POA: Diagnosis not present

## 2015-12-26 DIAGNOSIS — Z7901 Long term (current) use of anticoagulants: Secondary | ICD-10-CM | POA: Insufficient documentation

## 2015-12-26 DIAGNOSIS — I4891 Unspecified atrial fibrillation: Secondary | ICD-10-CM | POA: Diagnosis not present

## 2015-12-26 LAB — PROTIME-INR
INR: 4.92
Prothrombin Time: 47.2 seconds — ABNORMAL HIGH (ref 11.4–15.2)

## 2015-12-30 DIAGNOSIS — I340 Nonrheumatic mitral (valve) insufficiency: Secondary | ICD-10-CM | POA: Diagnosis not present

## 2015-12-30 DIAGNOSIS — R0602 Shortness of breath: Secondary | ICD-10-CM | POA: Diagnosis not present

## 2015-12-30 DIAGNOSIS — I480 Paroxysmal atrial fibrillation: Secondary | ICD-10-CM | POA: Diagnosis not present

## 2015-12-30 DIAGNOSIS — R600 Localized edema: Secondary | ICD-10-CM | POA: Diagnosis not present

## 2015-12-30 DIAGNOSIS — Z7901 Long term (current) use of anticoagulants: Secondary | ICD-10-CM | POA: Diagnosis not present

## 2015-12-31 DIAGNOSIS — Z9889 Other specified postprocedural states: Secondary | ICD-10-CM | POA: Diagnosis not present

## 2015-12-31 DIAGNOSIS — M62830 Muscle spasm of back: Secondary | ICD-10-CM | POA: Diagnosis not present

## 2016-01-02 DIAGNOSIS — E785 Hyperlipidemia, unspecified: Secondary | ICD-10-CM | POA: Diagnosis not present

## 2016-01-02 DIAGNOSIS — Z7901 Long term (current) use of anticoagulants: Secondary | ICD-10-CM | POA: Diagnosis not present

## 2016-01-02 DIAGNOSIS — I4891 Unspecified atrial fibrillation: Secondary | ICD-10-CM | POA: Diagnosis not present

## 2016-01-02 DIAGNOSIS — Z48812 Encounter for surgical aftercare following surgery on the circulatory system: Secondary | ICD-10-CM | POA: Diagnosis not present

## 2016-01-02 DIAGNOSIS — K219 Gastro-esophageal reflux disease without esophagitis: Secondary | ICD-10-CM | POA: Diagnosis not present

## 2016-01-02 DIAGNOSIS — I10 Essential (primary) hypertension: Secondary | ICD-10-CM | POA: Diagnosis not present

## 2016-01-07 DIAGNOSIS — E785 Hyperlipidemia, unspecified: Secondary | ICD-10-CM | POA: Diagnosis not present

## 2016-01-07 DIAGNOSIS — Z7901 Long term (current) use of anticoagulants: Secondary | ICD-10-CM | POA: Diagnosis not present

## 2016-01-07 DIAGNOSIS — Z48812 Encounter for surgical aftercare following surgery on the circulatory system: Secondary | ICD-10-CM | POA: Diagnosis not present

## 2016-01-07 DIAGNOSIS — K219 Gastro-esophageal reflux disease without esophagitis: Secondary | ICD-10-CM | POA: Diagnosis not present

## 2016-01-07 DIAGNOSIS — I10 Essential (primary) hypertension: Secondary | ICD-10-CM | POA: Diagnosis not present

## 2016-01-07 DIAGNOSIS — I4891 Unspecified atrial fibrillation: Secondary | ICD-10-CM | POA: Diagnosis not present

## 2016-01-09 DIAGNOSIS — Z7901 Long term (current) use of anticoagulants: Secondary | ICD-10-CM | POA: Diagnosis not present

## 2016-01-21 DIAGNOSIS — Z7901 Long term (current) use of anticoagulants: Secondary | ICD-10-CM | POA: Diagnosis not present

## 2016-01-21 DIAGNOSIS — I10 Essential (primary) hypertension: Secondary | ICD-10-CM | POA: Diagnosis not present

## 2016-01-21 DIAGNOSIS — K219 Gastro-esophageal reflux disease without esophagitis: Secondary | ICD-10-CM | POA: Diagnosis not present

## 2016-01-21 DIAGNOSIS — Z48812 Encounter for surgical aftercare following surgery on the circulatory system: Secondary | ICD-10-CM | POA: Diagnosis not present

## 2016-01-21 DIAGNOSIS — E785 Hyperlipidemia, unspecified: Secondary | ICD-10-CM | POA: Diagnosis not present

## 2016-01-21 DIAGNOSIS — I4891 Unspecified atrial fibrillation: Secondary | ICD-10-CM | POA: Diagnosis not present

## 2016-01-27 DIAGNOSIS — Z7901 Long term (current) use of anticoagulants: Secondary | ICD-10-CM | POA: Diagnosis not present

## 2016-01-27 DIAGNOSIS — R0602 Shortness of breath: Secondary | ICD-10-CM | POA: Diagnosis not present

## 2016-01-27 DIAGNOSIS — R600 Localized edema: Secondary | ICD-10-CM | POA: Diagnosis not present

## 2016-01-27 DIAGNOSIS — I480 Paroxysmal atrial fibrillation: Secondary | ICD-10-CM | POA: Diagnosis not present

## 2016-01-27 DIAGNOSIS — I340 Nonrheumatic mitral (valve) insufficiency: Secondary | ICD-10-CM | POA: Diagnosis not present

## 2016-01-28 DIAGNOSIS — E785 Hyperlipidemia, unspecified: Secondary | ICD-10-CM | POA: Diagnosis not present

## 2016-01-28 DIAGNOSIS — I4891 Unspecified atrial fibrillation: Secondary | ICD-10-CM | POA: Diagnosis not present

## 2016-01-28 DIAGNOSIS — K219 Gastro-esophageal reflux disease without esophagitis: Secondary | ICD-10-CM | POA: Diagnosis not present

## 2016-01-28 DIAGNOSIS — Z48812 Encounter for surgical aftercare following surgery on the circulatory system: Secondary | ICD-10-CM | POA: Diagnosis not present

## 2016-01-28 DIAGNOSIS — I10 Essential (primary) hypertension: Secondary | ICD-10-CM | POA: Diagnosis not present

## 2016-01-28 DIAGNOSIS — Z7901 Long term (current) use of anticoagulants: Secondary | ICD-10-CM | POA: Diagnosis not present

## 2016-02-02 DIAGNOSIS — Z7901 Long term (current) use of anticoagulants: Secondary | ICD-10-CM | POA: Diagnosis not present

## 2016-02-02 DIAGNOSIS — R002 Palpitations: Secondary | ICD-10-CM | POA: Diagnosis not present

## 2016-02-02 DIAGNOSIS — I480 Paroxysmal atrial fibrillation: Secondary | ICD-10-CM | POA: Diagnosis not present

## 2016-02-02 DIAGNOSIS — K625 Hemorrhage of anus and rectum: Secondary | ICD-10-CM | POA: Diagnosis not present

## 2016-02-02 DIAGNOSIS — Z9889 Other specified postprocedural states: Secondary | ICD-10-CM | POA: Diagnosis not present

## 2016-02-02 DIAGNOSIS — R0602 Shortness of breath: Secondary | ICD-10-CM | POA: Diagnosis not present

## 2016-02-04 DIAGNOSIS — K625 Hemorrhage of anus and rectum: Secondary | ICD-10-CM | POA: Diagnosis not present

## 2016-02-04 DIAGNOSIS — K219 Gastro-esophageal reflux disease without esophagitis: Secondary | ICD-10-CM | POA: Diagnosis not present

## 2016-02-04 DIAGNOSIS — I348 Other nonrheumatic mitral valve disorders: Secondary | ICD-10-CM | POA: Diagnosis not present

## 2016-02-10 ENCOUNTER — Telehealth (HOSPITAL_COMMUNITY): Payer: Self-pay | Admitting: Cardiac Rehabilitation

## 2016-02-19 DIAGNOSIS — Z9889 Other specified postprocedural states: Secondary | ICD-10-CM | POA: Diagnosis not present

## 2016-02-19 DIAGNOSIS — I480 Paroxysmal atrial fibrillation: Secondary | ICD-10-CM | POA: Diagnosis not present

## 2016-02-24 DIAGNOSIS — Z9889 Other specified postprocedural states: Secondary | ICD-10-CM | POA: Diagnosis not present

## 2016-02-24 DIAGNOSIS — I480 Paroxysmal atrial fibrillation: Secondary | ICD-10-CM | POA: Diagnosis not present

## 2016-02-24 DIAGNOSIS — Z7901 Long term (current) use of anticoagulants: Secondary | ICD-10-CM | POA: Diagnosis not present

## 2016-02-24 DIAGNOSIS — K625 Hemorrhage of anus and rectum: Secondary | ICD-10-CM | POA: Diagnosis not present

## 2016-02-24 DIAGNOSIS — R002 Palpitations: Secondary | ICD-10-CM | POA: Diagnosis not present

## 2016-02-24 DIAGNOSIS — R0602 Shortness of breath: Secondary | ICD-10-CM | POA: Diagnosis not present

## 2016-02-26 DIAGNOSIS — D124 Benign neoplasm of descending colon: Secondary | ICD-10-CM | POA: Diagnosis not present

## 2016-02-26 DIAGNOSIS — K921 Melena: Secondary | ICD-10-CM | POA: Diagnosis not present

## 2016-02-26 DIAGNOSIS — D123 Benign neoplasm of transverse colon: Secondary | ICD-10-CM | POA: Diagnosis not present

## 2016-02-26 DIAGNOSIS — D125 Benign neoplasm of sigmoid colon: Secondary | ICD-10-CM | POA: Diagnosis not present

## 2016-02-26 DIAGNOSIS — K635 Polyp of colon: Secondary | ICD-10-CM | POA: Diagnosis not present

## 2016-02-26 DIAGNOSIS — K625 Hemorrhage of anus and rectum: Secondary | ICD-10-CM | POA: Diagnosis not present

## 2016-03-02 ENCOUNTER — Encounter (HOSPITAL_COMMUNITY): Payer: Self-pay

## 2016-03-02 ENCOUNTER — Encounter (HOSPITAL_COMMUNITY)
Admission: RE | Admit: 2016-03-02 | Discharge: 2016-03-02 | Disposition: A | Discharge status: Home / Self Care | Payer: Medicare Other | Source: Ambulatory Visit | Attending: Cardiology | Admitting: Cardiology

## 2016-03-02 VITALS — BP 120/60 | HR 76 | Ht 63.0 in | Wt 127.2 lb

## 2016-03-02 DIAGNOSIS — Z9889 Other specified postprocedural states: Secondary | ICD-10-CM

## 2016-03-02 HISTORY — DX: Cardiac arrhythmia, unspecified: I49.9

## 2016-03-02 HISTORY — DX: Occlusion and stenosis of unspecified carotid artery: I65.29

## 2016-03-02 HISTORY — DX: Unspecified atrial fibrillation: I48.91

## 2016-03-02 HISTORY — DX: Pulmonary hypertension, unspecified: I27.20

## 2016-03-02 NOTE — Progress Notes (Signed)
Cardiac Rehab Medication Review by a Pharmacist  Does the patient  feel that his/her medications are working for him/her?  yes  Has the patient been experiencing any side effects to the medications prescribed?  no  Does the patient measure his/her own blood pressure or blood glucose at home?  yes   Does the patient have any problems obtaining medications due to transportation or finances?   no  Understanding of regimen: excellent Understanding of indications: excellent Potential of compliance: excellent    Laura Knapp 03/02/2016 2:43 PM

## 2016-03-02 NOTE — Progress Notes (Signed)
Mrs Penland attended orientation this afternoon. A 20 second run of bigeminal PVC's were noted today prior to her walk test along with some occasional PVC's. Patient asymptomatic. Vital signs stable. Today's ECG tracings with today's vital signs were faxed to Dr Irven Shelling office for review. Dr Irven Shelling office called and notified about today's ectopy. Will continue to monitor the patient throughout  the program. Mrs Ladino completed cardiac rehab without difficulty.Barnet Pall, RN,BSN 03/02/2016 4:10 PM

## 2016-03-04 ENCOUNTER — Encounter (HOSPITAL_COMMUNITY): Payer: Self-pay

## 2016-03-04 NOTE — Progress Notes (Signed)
Cardiac Individual Treatment Plan  Patient Details  Name: Laura Knapp MRN: [id number] Date of Birth: 1950/12/17 Referring Provider:   Flowsheet Row CARDIAC REHAB PHASE II ORIENTATION from 03/02/2016 in [facility]  Referring Provider  Adrian Prows, MD      Initial Encounter Date:  [place] PHASE II ORIENTATION from 03/02/2016 in [place]  Date  03/02/16  Referring Provider  Adrian Prows, MD      Visit Diagnosis: S/P mitral valve repair  Patient's Home Medications on Admission:  Current Outpatient Prescriptions:  .  acetaminophen (TYLENOL) 325 MG tablet, Take 650 mg by mouth every 6 (six) hours as needed for moderate pain., Disp: , Rfl:  .  atorvastatin (LIPITOR) 40 MG tablet, Take 40 mg by mouth daily., Disp: , Rfl:  .  brimonidine (ALPHAGAN) 0.2 % ophthalmic solution, Place 1 drop into both eyes 2 (two) times daily., Disp: , Rfl:  .  cyclobenzaprine (FLEXERIL) 10 MG tablet, Take 10 mg by mouth 3 (three) times daily as needed for muscle spasms., Disp: , Rfl:  .  dexlansoprazole (DEXILANT) 60 MG capsule, Take 60 mg by mouth daily., Disp: , Rfl:  .  dorzolamide-timolol (COSOPT) 22.3-6.8 MG/ML ophthalmic solution, Place 1 drop into both eyes 2 (two) times daily., Disp: , Rfl:  .  furosemide (LASIX) 40 MG tablet, Take 40 mg by mouth daily as needed for fluid or edema. , Disp: , Rfl:  .  latanoprost (XALATAN) 0.005 % ophthalmic solution, Place 1 drop into both eyes at bedtime., Disp: , Rfl:  .  metoprolol succinate (TOPROL-XL) 100 MG 24 hr tablet, Take 100 mg by mouth daily. Take with or immediately following a meal., Disp: , Rfl:  .  pilocarpine (PILOCAR) 4 % ophthalmic solution, Place 1 drop into both eyes 3 (three) times daily. , Disp: , Rfl:  .  traMADol (ULTRAM) 50 MG tablet, Take 50 mg by mouth every 6 (six) hours as needed for moderate pain or severe pain., Disp: , Rfl:  .  warfarin (COUMADIN) 5 MG  tablet, Take 5 mg by mouth daily. 2.5mg  daily except 5mg  on MWF, Disp: , Rfl:  .  ZETIA 10 MG tablet, Take 10 mg by mouth daily., Disp: , Rfl:  .  zolpidem (AMBIEN) 10 MG tablet, Take 10 mg by mouth at bedtime as needed for sleep. Takes 1/2 tablet, Disp: , Rfl:   Past Medical History: Past Medical History:  Diagnosis Date  . Arrhythmia    atrial fibrillation  . Atrial fibrillation ([place])   . 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 Heart murmur   . Hypertension   . Pulmonary hypertension     Tobacco Use: History  Smoking Status  . Never Smoker  Smokeless Tobacco  . Former Systems developer  . Quit date: 03/04/1990    Labs: Recent Review Flowsheet Data    Labs for ITP Cardiac and Pulmonary Rehab Latest Ref Rng & Units 10/16/2015 10/16/2015 10/16/2015   PHART 7.350 - 7.450 - - 7.321(L)   PCO2ART 35.0 - 45.0 mmHg - - 45.5(H)   HCO3 20.0 - 24.0 mEq/L 24.2(H) 24.4(H) 23.5   TCO2 0 - 100 mmol/L 26 26 25    ACIDBASEDEF 0.0 - 2.0 mmol/L 2.0 2.0 3.0(H)   O2SAT % 64.0 64.0 91.0      Capillary Blood Glucose: No results found for: GLUCAP   Exercise Target Goals: Date: 03/02/16  Exercise Program Goal: Individual exercise prescription  set with THRR, safety & activity barriers. Participant demonstrates ability to understand and report RPE using BORG scale, to self-measure pulse accurately, and to acknowledge the importance of the exercise prescription.  Exercise Prescription Goal: Starting with aerobic activity 30 plus minutes a day, 3 days per week for initial exercise prescription. Provide home exercise prescription and guidelines that participant acknowledges understanding prior to discharge.  Activity Barriers & Risk Stratification:     Activity Barriers & Cardiac Risk Stratification - 03/02/16 1413      Activity Barriers & Cardiac Risk Stratification   Activity Barriers Back Problems;History of Falls;Other (comment)   Comments Glaucoma surgery, issues with depth  perception.   Cardiac Risk Stratification Moderate      6 Minute Walk:     6 Minute Walk    Row Name 03/02/16 1610         6 Minute Walk   Phase Initial     Distance 1453 feet     Walk Time 6 minutes     # of Rest Breaks 0     MPH 2.75     METS 3.66     RPE 11     VO2 Peak 12.81     Symptoms Yes (comment)     Comments Mild SOB.     Resting HR 76 bpm     Resting BP 120/60     Max Ex. HR 100 bpm     Max Ex. BP 144/78     2 Minute Post BP 120/70        Initial Exercise Prescription:     Initial Exercise Prescription - 03/03/16 0700      Date of Initial Exercise RX and Referring Provider   Date 03/02/16   Referring Provider Adrian Prows, MD     Bike   Level 0.8   Minutes 10   METs 3.63     NuStep   Level 2   Minutes 10   METs 2.8     Track   Laps 12   Minutes 10   METs 3.09     Prescription Details   Frequency (times per week) 3   Duration Progress to 30 minutes of continuous aerobic without signs/symptoms of physical distress     Intensity   THRR 40-80% of Max Heartrate 62-123   Ratings of Perceived Exertion 11-13   Perceived Dyspnea 0-4     Progression   Progression Continue to progress workloads to maintain intensity without signs/symptoms of physical distress.     Resistance Training   Training Prescription Yes   Weight 2lbs.   Reps 10-12      Perform Capillary Blood Glucose checks as needed.  Exercise Prescription Changes:   Exercise Comments:   Discharge Exercise Prescription (Final Exercise Prescription Changes):   Nutrition:  Target Goals: Understanding of nutrition guidelines, daily intake of sodium 1500mg , cholesterol 200mg , calories 30% from fat and 7% or less from saturated fats, daily to have 5 or more servings of fruits and vegetables.  Biometrics:     Pre Biometrics - 03/02/16 1611      Pre Biometrics   Height 5\' 3"  (1.6 m)   Weight 127 lb 3.3 oz (57.7 kg)   Waist Circumference 27.75 inches   Hip  Circumference 35.75 inches   Waist to Hip Ratio 0.78 %   BMI (Calculated) 22.6   Triceps Skinfold 17 mm   % Body Fat 31.1 %   Grip Strength 23.5 kg   Flexibility 0 in  Single Leg Stand 6.75 seconds       Nutrition Therapy Plan and Nutrition Goals:   Nutrition Discharge: Nutrition Scores:   Nutrition Goals Re-Evaluation:   Psychosocial: Target Goals: Acknowledge presence or absence of depression, maximize coping skills, provide positive support system. Participant is able to verbalize types and ability to use techniques and skills needed for reducing stress and depression.  Initial Review & Psychosocial Screening:     Initial Psych Review & Screening - 03/04/16 [place]? Yes     Barriers   Psychosocial barriers to participate in program There are no identifiable barriers or psychosocial needs.     Screening Interventions   Interventions Encouraged to exercise      Quality of Life Scores:     Quality of Life - 03/02/16 1354      Quality of Life Scores   Health/Function Pre 29.2 %   Socioeconomic Pre 30 %   Psych/Spiritual Pre 24.86 %   Family Pre 28.8 %   GLOBAL Pre 28.41 %      PHQ-9: Recent Review Flowsheet Data    There is no flowsheet data to display.      Psychosocial Evaluation and Intervention:   Psychosocial Re-Evaluation:   Vocational Rehabilitation: Provide vocational rehab assistance to qualifying candidates.   Vocational Rehab Evaluation & Intervention:     Vocational Rehab - 03/04/16 1143      Initial Vocational Rehab Evaluation & Intervention   Assessment shows need for Vocational Rehabilitation No  Mrs Feng is retired      Education: Education Goals: Education classes will be provided on a weekly basis, covering required topics. Participant will state understanding/return demonstration of topics presented.  Learning Barriers/Preferences:     Learning Barriers/Preferences -  03/03/16 0719      Learning Barriers/Preferences   Learning Barriers Hearing   Learning Preferences Skilled Demonstration      Education Topics: Count Your Pulse:  -Group instruction provided by verbal instruction, demonstration, patient participation and written materials to support subject.  Instructors address importance of being able to find your pulse and how to count your pulse when at home without a heart monitor.  Patients get hands on experience counting their pulse with staff help and individually.   Heart Attack, Angina, and Risk Factor Modification:  -Group instruction provided by verbal instruction, video, and written materials to support subject.  Instructors address signs and symptoms of angina and heart attacks.    Also discuss risk factors for heart disease and how to make changes to improve heart health risk factors.   Functional Fitness:  -Group instruction provided by verbal instruction, demonstration, patient participation, and written materials to support subject.  Instructors address safety measures for doing things around the house.  Discuss how to get up and down off the floor, how to pick things up properly, how to safely get out of a chair without assistance, and balance training.   Meditation and Mindfulness:  -Group instruction provided by verbal instruction, patient participation, and written materials to support subject.  Instructor addresses importance of mindfulness and meditation practice to help reduce stress and improve awareness.  Instructor also leads participants through a meditation exercise.    Stretching for Flexibility and Mobility:  -Group instruction provided by verbal instruction, patient participation, and written materials to support subject.  Instructors lead participants through series of stretches that are designed to increase flexibility thus improving mobility.  These stretches are  additional exercise for major muscle groups that are  typically performed during regular warm up and cool down.   Hands Only CPR Anytime:  -Group instruction provided by verbal instruction, video, patient participation and written materials to support subject.  Instructors co-teach with AHA video for hands only CPR.  Participants get hands on experience with mannequins.   Nutrition I class: Heart Healthy Eating:  -Group instruction provided by PowerPoint slides, verbal discussion, and written materials to support subject matter. The instructor gives an explanation and review of the Therapeutic Lifestyle Changes diet recommendations, which includes a discussion on lipid goals, dietary fat, sodium, fiber, plant stanol/sterol esters, sugar, and the components of a well-balanced, healthy diet.   Nutrition II class: Lifestyle Skills:  -Group instruction provided by PowerPoint slides, verbal discussion, and written materials to support subject matter. The instructor gives an explanation and review of label reading, grocery shopping for heart health, heart healthy recipe modifications, and ways to make healthier choices when eating out.   Diabetes Question & Answer:  -Group instruction provided by PowerPoint slides, verbal discussion, and written materials to support subject matter. The instructor gives an explanation and review of diabetes co-morbidities, pre- and post-prandial blood glucose goals, pre-exercise blood glucose goals, signs, symptoms, and treatment of hypoglycemia and hyperglycemia, and foot care basics.   Diabetes Blitz:  -Group instruction provided by PowerPoint slides, verbal discussion, and written materials to support subject matter. The instructor gives an explanation and review of the physiology behind type 1 and type 2 diabetes, diabetes medications and rational behind using different medications, pre- and post-prandial blood glucose recommendations and Hemoglobin A1c goals, diabetes diet, and exercise including blood glucose  guidelines for exercising safely.    Portion Distortion:  -Group instruction provided by PowerPoint slides, verbal discussion, written materials, and food models to support subject matter. The instructor gives an explanation of serving size versus portion size, changes in portions sizes over the last 20 years, and what consists of a serving from each food group.   Stress Management:  -Group instruction provided by verbal instruction, video, and written materials to support subject matter.  Instructors review role of stress in heart disease and how to cope with stress positively.     Exercising on Your Own:  -Group instruction provided by verbal instruction, power point, and written materials to support subject.  Instructors discuss benefits of exercise, components of exercise, frequency and intensity of exercise, and end points for exercise.  Also discuss use of nitroglycerin and activating EMS.  Review options of places to exercise outside of rehab.  Review guidelines for sex with heart disease.   Cardiac Drugs I:  -Group instruction provided by verbal instruction and written materials to support subject.  Instructor reviews cardiac drug classes: antiplatelets, anticoagulants, beta blockers, and statins.  Instructor discusses reasons, side effects, and lifestyle considerations for each drug class.   Cardiac Drugs II:  -Group instruction provided by verbal instruction and written materials to support subject.  Instructor reviews cardiac drug classes: angiotensin converting enzyme inhibitors (ACE-I), angiotensin II receptor blockers (ARBs), nitrates, and calcium channel blockers.  Instructor discusses reasons, side effects, and lifestyle considerations for each drug class.   Anatomy and Physiology of the Circulatory System:  -Group instruction provided by verbal instruction, video, and written materials to support subject.  Reviews functional anatomy of heart, how it relates to various  diagnoses, and what role the heart plays in the overall system.   Knowledge Questionnaire Score:     Knowledge Questionnaire  Score - 03/02/16 1354      Knowledge Questionnaire Score   Pre Score 18/24      Core Components/Risk Factors/Patient Goals at Admission:     Personal Goals and Risk Factors at Admission - 03/02/16 1156      Core Components/Risk Factors/Patient Goals on Admission   Hypertension Yes   Intervention Provide education on lifestyle modifcations including regular physical activity/exercise, weight management, moderate sodium restriction and increased consumption of fresh fruit, vegetables, and low fat dairy, alcohol moderation, and smoking cessation.;Monitor prescription use compliance.   Expected Outcomes Short Term: Continued assessment and intervention until BP is < 140/30mm HG in hypertensive participants. < 130/74mm HG in hypertensive participants with diabetes, heart failure or chronic kidney disease.;Long Term: Maintenance of blood pressure at goal levels.   Lipids Yes   Intervention Provide education and support for participant on nutrition & aerobic/resistive exercise along with prescribed medications to achieve LDL 70mg , HDL >40mg .   Expected Outcomes Short Term: Participant states understanding of desired cholesterol values and is compliant with medications prescribed. Participant is following exercise prescription and nutrition guidelines.;Long Term: Cholesterol controlled with medications as prescribed, with individualized exercise RX and with personalized nutrition plan. Value goals: LDL < 70mg , HDL > 40 mg.   Stress Yes   Intervention Offer individual and/or small group education and counseling on adjustment to heart disease, stress management and health-related lifestyle change. Teach and support self-help strategies.;Refer participants experiencing significant psychosocial distress to appropriate mental health specialists for further evaluation and  treatment. When possible, include family members and significant others in education/counseling sessions.   Expected Outcomes Short Term: Participant demonstrates changes in health-related behavior, relaxation and other stress management skills, ability to obtain effective social support, and compliance with psychotropic medications if prescribed.;Long Term: Emotional wellbeing is indicated by absence of clinically significant psychosocial distress or social isolation.   Personal Goal Other Yes   Personal Goal Improve posture. Increase muscle tone. Know capabilities for [facility]   Intervention Provide individulaized home exercise program with parameters for exercise, including stretching, aerobic exercise and resistance training to improve muscle tone and improve fitness.   [facility] Improved muscle tone measured by tricep skinfold measurement and grip strength.      Core Components/Risk Factors/Patient Goals Review:    Core Components/Risk Factors/Patient Goals at Discharge (Final Review):    ITP Comments:     ITP Comments    Row Name 03/02/16 1148           ITP Comments Dr. Fransico Him, Medical Director           Comments: Patient attended orientation from 1300 to 1445 to review rules and guidelines for program. Completed 6 minute walk test, Intitial ITP, and exercise prescription.  VSS. Telemetry-Sinus Rhythm with a nonsustained run of bigeminal PVC's.  Asymptomatic.Barnet Pall, RN,BSN 03/04/2016 11:52 AM

## 2016-03-08 ENCOUNTER — Encounter (HOSPITAL_COMMUNITY)
Admission: RE | Admit: 2016-03-08 | Discharge: 2016-03-08 | Disposition: A | Discharge status: Home / Self Care | Payer: Medicare Other | Source: Ambulatory Visit | Attending: Cardiology | Admitting: Cardiology

## 2016-03-08 ENCOUNTER — Encounter (HOSPITAL_COMMUNITY): Payer: Self-pay

## 2016-03-08 DIAGNOSIS — Z9889 Other specified postprocedural states: Secondary | ICD-10-CM

## 2016-03-08 NOTE — Progress Notes (Signed)
Daily Session Note  Patient Details  Name: Laura Knapp MRN: [id number] Date of Birth: 12/27/1950 Referring Provider:   Flowsheet Row CARDIAC REHAB PHASE II ORIENTATION from 03/02/2016 in [place]  Referring Provider  Adrian Prows, MD      Encounter Date: 03/08/2016  Check In:     Session Check In - 03/08/16 1420      Check-In   Location [facility]   Staff Present Barnet Pall, RN, BSN;Other;Joann Rion, RN, BSN;Amber Fair, MS, ACSM RCEP, Exercise Physiologist   Supervising physician immediately available to respond to emergencies [facility] immediately available   Physician(s) Dr. Theone Murdoch    Medication changes reported     No   Fall or balance concerns reported    No   Warm-up and Cool-down Performed as group-led instruction   Resistance Training Performed Yes   VAD Patient? No     Pain Assessment   Currently in Pain? No/denies      Capillary Blood Glucose: No results found for this or any previous visit (from the past 24 hour(s)).   Goals Met:  Exercise tolerated well  Goals Unmet:  Not Applicable  Comments: Pt started cardiac rehab today.  Pt tolerated light exercise without difficulty. VSS, telemetry-sinus , asymptomatic.  Medication list reconciled. Pt denies barriers to medicaiton compliance.  PSYCHOSOCIAL ASSESSMENT:  PHQ-0 Pt exhibits positive coping skills, hopeful outlook with supportive family. No psychosocial needs identified at this time, no psychosocial interventions necessary.    Pt enjoys church activities.   Pt oriented to exercise equipment and routine.    Understanding verbalized.   Dr. Fransico Him is Medical Director for Cardiac Rehab at [facility].

## 2016-03-09 DIAGNOSIS — H5010 Unspecified exotropia: Secondary | ICD-10-CM | POA: Diagnosis not present

## 2016-03-09 DIAGNOSIS — Q132 Other congenital malformations of iris: Secondary | ICD-10-CM | POA: Diagnosis not present

## 2016-03-09 DIAGNOSIS — H401133 Primary open-angle glaucoma, bilateral, severe stage: Secondary | ICD-10-CM | POA: Diagnosis not present

## 2016-03-09 DIAGNOSIS — H2512 Age-related nuclear cataract, left eye: Secondary | ICD-10-CM | POA: Diagnosis not present

## 2016-03-10 ENCOUNTER — Encounter (HOSPITAL_COMMUNITY)
Admission: RE | Admit: 2016-03-10 | Discharge: 2016-03-10 | Disposition: A | Discharge status: Home / Self Care | Payer: Medicare Other | Source: Ambulatory Visit | Attending: Cardiology | Admitting: Cardiology

## 2016-03-10 DIAGNOSIS — Z9889 Other specified postprocedural states: Secondary | ICD-10-CM

## 2016-03-11 DIAGNOSIS — Z7901 Long term (current) use of anticoagulants: Secondary | ICD-10-CM | POA: Diagnosis not present

## 2016-03-12 ENCOUNTER — Encounter (HOSPITAL_COMMUNITY): Payer: Medicare Other

## 2016-03-15 ENCOUNTER — Encounter (HOSPITAL_COMMUNITY): Payer: Medicare Other

## 2016-03-15 ENCOUNTER — Telehealth (HOSPITAL_COMMUNITY): Payer: Self-pay | Admitting: Family Medicine

## 2016-03-15 DIAGNOSIS — R42 Dizziness and giddiness: Secondary | ICD-10-CM | POA: Diagnosis not present

## 2016-03-15 DIAGNOSIS — I4891 Unspecified atrial fibrillation: Secondary | ICD-10-CM | POA: Diagnosis not present

## 2016-03-15 DIAGNOSIS — H6122 Impacted cerumen, left ear: Secondary | ICD-10-CM | POA: Diagnosis not present

## 2016-03-17 ENCOUNTER — Encounter (HOSPITAL_COMMUNITY)
Admission: RE | Admit: 2016-03-17 | Discharge: 2016-03-17 | Disposition: A | Discharge status: Home / Self Care | Payer: Medicare Other | Source: Ambulatory Visit | Attending: Cardiology | Admitting: Cardiology

## 2016-03-17 DIAGNOSIS — Z9889 Other specified postprocedural states: Secondary | ICD-10-CM

## 2016-03-17 NOTE — Progress Notes (Addendum)
Incomplete Session Note  Patient Details  Name: Laura Knapp MRN: [id number] Date of Birth: 1951/04/24 Referring Provider:   Flowsheet Row CARDIAC REHAB PHASE II ORIENTATION from 03/02/2016 in [place]  Referring Provider  Adrian Prows, MD      [name] did not complete her rehab session.  Ledell reports that she has experienced intermittent shortness of breath since Monday. Magdalyn also reports that she has been experiencing palpitations. Tyshauna was noted to have some nonsustained SVT while sitting rate noted at 123. Dr Einar Gip  Reviewed the ECG tracings wants to start Mrs Morreale on Sotalol 80 mg twice a day for Paroxsymal Atrial fibrilation . Cardiac  Rehab staff called and received pharmacy notifications.  I spoke with Dr Einar Gip over the phone. Dr Einar Gip said Mrs Campodonico will be okay to go home and start her Sotalol tonight. Dr Einar Gip wants Mrs Reise to take 1/2 the dose of Sotalol tonight. Anderson Malta, Dr Irven Shelling nurse instructed Mrs Haagensen over the phone to decrease her Toprol to 50 mg once a day. Oxygen saturation 100% on room air. Shanikka called her husband to take her home since she drove here today from [place]. Patient states understanding of her medications changes and has an appointment to follow up with Dr Einar Gip on Friday morning at 10:45 AM. Exit blood pressure 138/70. Heart rate 60 Sinus.Barnet Pall, RN,BSN 03/17/2016 3:14 PM

## 2016-03-19 DIAGNOSIS — Z7901 Long term (current) use of anticoagulants: Secondary | ICD-10-CM | POA: Diagnosis not present

## 2016-03-19 DIAGNOSIS — I480 Paroxysmal atrial fibrillation: Secondary | ICD-10-CM | POA: Diagnosis not present

## 2016-03-19 DIAGNOSIS — Z9889 Other specified postprocedural states: Secondary | ICD-10-CM | POA: Diagnosis not present

## 2016-03-19 DIAGNOSIS — I10 Essential (primary) hypertension: Secondary | ICD-10-CM | POA: Diagnosis not present

## 2016-03-22 ENCOUNTER — Encounter (HOSPITAL_COMMUNITY): Payer: Medicare Other

## 2016-03-22 DIAGNOSIS — I10 Essential (primary) hypertension: Secondary | ICD-10-CM | POA: Diagnosis not present

## 2016-03-22 DIAGNOSIS — Z9889 Other specified postprocedural states: Secondary | ICD-10-CM | POA: Diagnosis not present

## 2016-03-22 DIAGNOSIS — I480 Paroxysmal atrial fibrillation: Secondary | ICD-10-CM | POA: Diagnosis not present

## 2016-03-24 ENCOUNTER — Encounter (HOSPITAL_COMMUNITY): Payer: Medicare Other

## 2016-03-24 ENCOUNTER — Telehealth (HOSPITAL_COMMUNITY): Payer: Self-pay | Admitting: Family Medicine

## 2016-03-26 ENCOUNTER — Encounter (HOSPITAL_COMMUNITY): Payer: Medicare Other

## 2016-03-29 ENCOUNTER — Encounter (HOSPITAL_COMMUNITY)
Admission: RE | Admit: 2016-03-29 | Discharge: 2016-03-29 | Disposition: A | Discharge status: Home / Self Care | Payer: Medicare Other | Source: Ambulatory Visit | Attending: Cardiology | Admitting: Cardiology

## 2016-03-29 DIAGNOSIS — Z9889 Other specified postprocedural states: Secondary | ICD-10-CM | POA: Diagnosis not present

## 2016-03-29 DIAGNOSIS — R0602 Shortness of breath: Secondary | ICD-10-CM | POA: Diagnosis not present

## 2016-03-29 NOTE — Progress Notes (Signed)
Pt arrived at cardiac rehab reporting medication changes per Dr. Einar Gip.  Medication list reconciled.  Pt tolerated exercise without difficulty.

## 2016-03-30 DIAGNOSIS — R0602 Shortness of breath: Secondary | ICD-10-CM | POA: Diagnosis not present

## 2016-03-31 ENCOUNTER — Encounter (HOSPITAL_COMMUNITY)
Admission: RE | Admit: 2016-03-31 | Discharge: 2016-03-31 | Disposition: A | Discharge status: Home / Self Care | Payer: Medicare Other | Source: Ambulatory Visit | Attending: Cardiology | Admitting: Cardiology

## 2016-03-31 DIAGNOSIS — Z9889 Other specified postprocedural states: Secondary | ICD-10-CM | POA: Diagnosis not present

## 2016-03-31 NOTE — Progress Notes (Signed)
Cardiac Individual Treatment Plan  Patient Details  Name: Laura Knapp MRN: [id number] Date of Birth: 1951/04/28 Referring Provider:   Flowsheet Row CARDIAC REHAB PHASE II ORIENTATION from 03/02/2016 in [place]  Referring Provider  Adrian Prows, MD      Initial Encounter Date:  [facility] PHASE II ORIENTATION from 03/02/2016 in [place]  Date  03/02/16  Referring Provider  Adrian Prows, MD      Visit Diagnosis: S/P mitral valve repair  Patient's Home Medications on Admission:  Current Outpatient Prescriptions:  .  acetaminophen (TYLENOL) 325 MG tablet, Take 650 mg by mouth every 6 (six) hours as needed for moderate pain., Disp: , Rfl:  .  atorvastatin (LIPITOR) 40 MG tablet, Take 40 mg by mouth daily., Disp: , Rfl:  .  brimonidine (ALPHAGAN) 0.2 % ophthalmic solution, Place 1 drop into both eyes 2 (two) times daily., Disp: , Rfl:  .  cyclobenzaprine (FLEXERIL) 10 MG tablet, Take 10 mg by mouth 3 (three) times daily as needed for muscle spasms., Disp: , Rfl:  .  dexlansoprazole (DEXILANT) 60 MG capsule, Take 60 mg by mouth daily., Disp: , Rfl:  .  dorzolamide-timolol (COSOPT) 22.3-6.8 MG/ML ophthalmic solution, Place 1 drop into both eyes 2 (two) times daily., Disp: , Rfl:  .  furosemide (LASIX) 40 MG tablet, Take 40 mg by mouth daily as needed for fluid or edema. , Disp: , Rfl:  .  latanoprost (XALATAN) 0.005 % ophthalmic solution, Place 1 drop into both eyes at bedtime., Disp: , Rfl:  .  metoprolol succinate (TOPROL-XL) 100 MG 24 hr tablet, Take 100 mg by mouth daily. Take with or immediately following a meal., Disp: , Rfl:  .  pilocarpine (PILOCAR) 4 % ophthalmic solution, Place 1 drop into both eyes 3 (three) times daily. , Disp: , Rfl:  .  sotalol (BETAPACE) 80 MG tablet, Take 120 mg by mouth 2 (two) times daily., Disp: , Rfl:  .  traMADol (ULTRAM) 50 MG tablet, Take 50 mg by mouth every 6 (six)  hours as needed for moderate pain or severe pain., Disp: , Rfl:  .  valsartan-hydrochlorothiazide (DIOVAN-HCT) 160-12.5 MG tablet, Take 1 tablet by mouth daily., Disp: , Rfl:  .  warfarin (COUMADIN) 5 MG tablet, Take 5 mg by mouth daily. 2.5mg  daily except 5mg  on MWF, Disp: , Rfl:  .  ZETIA 10 MG tablet, Take 10 mg by mouth daily., Disp: , Rfl:  .  zolpidem (AMBIEN) 10 MG tablet, Take 10 mg by mouth at bedtime as needed for sleep. Takes 1/2 tablet, Disp: , Rfl:   Past Medical History: Past Medical History:  Diagnosis Date  . Arrhythmia    atrial fibrillation  . Atrial fibrillation ([place])   . 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 Heart murmur   . Hypertension   . Pulmonary hypertension     Tobacco Use: History  Smoking Status  . Never Smoker  Smokeless Tobacco  . Former Systems developer  . Quit date: 03/04/1990    Labs: Recent Review Flowsheet Data    Labs for ITP Cardiac and Pulmonary Rehab Latest Ref Rng & Units 10/16/2015 10/16/2015 10/16/2015   PHART 7.350 - 7.450 - - 7.321(L)   PCO2ART 35.0 - 45.0 mmHg - - 45.5(H)   HCO3 20.0 - 24.0 mEq/L 24.2(H) 24.4(H) 23.5   TCO2 0 - 100 mmol/L 26 26 25    ACIDBASEDEF 0.0 - 2.0 mmol/L  2.0 2.0 3.0(H)   O2SAT % 64.0 64.0 91.0      Capillary Blood Glucose: No results found for: GLUCAP   Exercise Target Goals:    Exercise Program Goal: Individual exercise prescription set with THRR, safety & activity barriers. Participant demonstrates ability to understand and report RPE using BORG scale, to self-measure pulse accurately, and to acknowledge the importance of the exercise prescription.  Exercise Prescription Goal: Starting with aerobic activity 30 plus minutes a day, 3 days per week for initial exercise prescription. Provide home exercise prescription and guidelines that participant acknowledges understanding prior to discharge.  Activity Barriers & Risk Stratification:     Activity Barriers & Cardiac Risk Stratification  - 03/02/16 1413      Activity Barriers & Cardiac Risk Stratification   Activity Barriers Back Problems;History of Falls;Other (comment)   Comments Glaucoma surgery, issues with depth perception.   Cardiac Risk Stratification Moderate      6 Minute Walk:     6 Minute Walk    Row Name 03/02/16 1610         6 Minute Walk   Phase Initial     Distance 1453 feet     Walk Time 6 minutes     # of Rest Breaks 0     MPH 2.75     METS 3.66     RPE 11     VO2 Peak 12.81     Symptoms Yes (comment)     Comments Mild SOB.     Resting HR 76 bpm     Resting BP 120/60     Max Ex. HR 100 bpm     Max Ex. BP 144/78     2 Minute Post BP 120/70        Initial Exercise Prescription:     Initial Exercise Prescription - 03/03/16 0700      Date of Initial Exercise RX and Referring Provider   Date 03/02/16   Referring Provider Adrian Prows, MD     Bike   Level 0.8   Minutes 10   METs 3.63     NuStep   Level 2   Minutes 10   METs 2.8     Track   Laps 12   Minutes 10   METs 3.09     Prescription Details   Frequency (times per week) 3   Duration Progress to 30 minutes of continuous aerobic without signs/symptoms of physical distress     Intensity   THRR 40-80% of Max Heartrate 62-123   Ratings of Perceived Exertion 11-13   Perceived Dyspnea 0-4     Progression   Progression Continue to progress workloads to maintain intensity without signs/symptoms of physical distress.     Resistance Training   Training Prescription Yes   Weight 2lbs.   Reps 10-12      Perform Capillary Blood Glucose checks as needed.  Exercise Prescription Changes:      Exercise Prescription Changes    Row Name 03/30/16 1600 03/31/16 1600           Exercise Review   Progression Yes Yes        Response to Exercise   Blood Pressure (Admit) 132/60 153/84      Blood Pressure (Exercise) 130/80 144/60      Blood Pressure (Exit) 128/62 128/70      Heart Rate (Admit) 57 bpm 58 bpm       Heart Rate (Exercise) 87 bpm 89 bpm  Heart Rate (Exit) 57 bpm 58 bpm      Rating of Perceived Exertion (Exercise) 13 13      Symptoms none none      Comments  - reviewed HEP on 03/31/16      Duration Progress to 30 minutes of continuous aerobic without signs/symptoms of physical distress Progress to 30 minutes of continuous aerobic without signs/symptoms of physical distress      Intensity THRR unchanged THRR unchanged        Progression   Average METs 2.8 2.8        Resistance Training   Training Prescription Yes Yes      Weight 1lb 1lb      Reps 10-12 10-12        Bike   Level 0.8 0.8      Minutes 15 15      METs 3.86 3.86        NuStep   Level 2 2      Minutes 15 15      METs 1.8 1.8        Home Exercise Plan   Plans to continue exercise at  - Home  reviewed on 03/31/16 see progress note      Frequency  - Add 2 additional days to program exercise sessions.         Exercise Comments:      Exercise Comments    Row Name 03/31/16 1447           Exercise Comments Reviewed METs and goals. Pt is toleraring exercise well; will continue to monitor exercise progression          Discharge Exercise Prescription (Final Exercise Prescription Changes):     Exercise Prescription Changes - 03/31/16 1600      Exercise Review   Progression Yes     Response to Exercise   Blood Pressure (Admit) 153/84   Blood Pressure (Exercise) 144/60   Blood Pressure (Exit) 128/70   Heart Rate (Admit) 58 bpm   Heart Rate (Exercise) 89 bpm   Heart Rate (Exit) 58 bpm   Rating of Perceived Exertion (Exercise) 13   Symptoms none   Comments reviewed HEP on 03/31/16   Duration Progress to 30 minutes of continuous aerobic without signs/symptoms of physical distress   Intensity THRR unchanged     Progression   Average METs 2.8     Resistance Training   Training Prescription Yes   Weight 1lb   Reps 10-12     Bike   Level 0.8   Minutes 15   METs 3.86     NuStep   Level 2    Minutes 15   METs 1.8     Home Exercise Plan   Plans to continue exercise at Home  reviewed on 03/31/16 see progress note   Frequency Add 2 additional days to program exercise sessions.      Nutrition:  Target Goals: Understanding of nutrition guidelines, daily intake of sodium 1500mg , cholesterol 200mg , calories 30% from fat and 7% or less from saturated fats, daily to have 5 or more servings of fruits and vegetables.  Biometrics:     Pre Biometrics - 03/02/16 1611      Pre Biometrics   Height 5\' 3"  (1.6 m)   Weight 127 lb 3.3 oz (57.7 kg)   Waist Circumference 27.75 inches   Hip Circumference 35.75 inches   Waist to Hip Ratio 0.78 %   BMI (Calculated) 22.6   Triceps Skinfold  17 mm   % Body Fat 31.1 %   Grip Strength 23.5 kg   Flexibility 0 in   Single Leg Stand 6.75 seconds       Nutrition Therapy Plan and Nutrition Goals:     Nutrition Therapy & Goals - 03/05/16 1045      Nutrition Therapy   Diet General, healthful     Personal Nutrition Goals   Personal Goal #1 Wt maintenance while in [place], educate and counsel regarding individualized specific dietary modifications aiming towards targeted core components such as weight, hypertension, lipid management, diabetes, heart failure and other comorbidities.   Expected Outcomes Short Term Goal: Understand basic principles of dietary content, such as calories, fat, sodium, cholesterol and nutrients.;Long Term Goal: Adherence to prescribed nutrition plan.      Nutrition Discharge: Nutrition Scores:     Nutrition Assessments - 03/05/16 1047      MEDFICTS Scores   Pre Score 31      Nutrition Goals Re-Evaluation:   Psychosocial: Target Goals: Acknowledge presence or absence of depression, maximize coping skills, provide positive support system. Participant is able to verbalize types and ability to use techniques and skills needed for reducing stress and  depression.  Initial Review & Psychosocial Screening:     Initial Psych Review & Screening - 03/04/16 [place]? Yes     Barriers   Psychosocial barriers to participate in program There are no identifiable barriers or psychosocial needs.     Screening Interventions   Interventions Encouraged to exercise      Quality of Life Scores:     Quality of Life - 03/02/16 1354      Quality of Life Scores   Health/Function Pre 29.2 %   Socioeconomic Pre 30 %   Psych/Spiritual Pre 24.86 %   Family Pre 28.8 %   GLOBAL Pre 28.41 %      PHQ-9: Recent Review Flowsheet Data    Depression screen PHQ 2/9 03/08/2016   Decreased Interest 0   Down, Depressed, Hopeless 0   PHQ - 2 Score 0      Psychosocial Evaluation and Intervention:     Psychosocial Evaluation - 03/30/16 1001      Psychosocial Evaluation & Interventions   Interventions Stress management education;Relaxation education   Comments no psychosocial needs identified, no interventions necessary.    Continued Psychosocial Services Needed No      Psychosocial Re-Evaluation:     Psychosocial Re-Evaluation    [place] Name 03/30/16 1001             Psychosocial Re-Evaluation   Interventions Encouraged to attend Cardiac Rehabilitation for the exercise;Stress management education;Relaxation education       Comments no psychosocial needs identified, no interventions necessary.  pt with frequent absences due to medication adjustments. pt symptoms are relieved and pt is looking forward to resuming her CR participation.        Continued Psychosocial Services Needed No          Vocational Rehabilitation: Provide vocational rehab assistance to qualifying candidates.   Vocational Rehab Evaluation & Intervention:     Vocational Rehab - 03/04/16 1143      Initial Vocational Rehab Evaluation & Intervention   Assessment shows need for Vocational Rehabilitation No  Mrs Stockford is  retired      Education: Education Goals: Education classes will be provided on  a weekly basis, covering required topics. Participant will state understanding/return demonstration of topics presented.  Learning Barriers/Preferences:     Learning Barriers/Preferences - 03/03/16 0719      Learning Barriers/Preferences   Learning Barriers Hearing   Learning Preferences Skilled Demonstration      Education Topics: Count Your Pulse:  -Group instruction provided by verbal instruction, demonstration, patient participation and written materials to support subject.  Instructors address importance of being able to find your pulse and how to count your pulse when at home without a heart monitor.  Patients get hands on experience counting their pulse with staff help and individually.   Heart Attack, Angina, and Risk Factor Modification:  -Group instruction provided by verbal instruction, video, and written materials to support subject.  Instructors address signs and symptoms of angina and heart attacks.    Also discuss risk factors for heart disease and how to make changes to improve heart health risk factors.   Functional Fitness:  -Group instruction provided by verbal instruction, demonstration, patient participation, and written materials to support subject.  Instructors address safety measures for doing things around the house.  Discuss how to get up and down off the floor, how to pick things up properly, how to safely get out of a chair without assistance, and balance training.   Meditation and Mindfulness:  -Group instruction provided by verbal instruction, patient participation, and written materials to support subject.  Instructor addresses importance of mindfulness and meditation practice to help reduce stress and improve awareness.  Instructor also leads participants through a meditation exercise.  Flowsheet Row CARDIAC REHAB PHASE II EXERCISE from 03/31/2016 in [place]  Date  03/17/16  Educator  [place]  Instruction Review Code  2- meets goals/outcomes      Stretching for Flexibility and Mobility:  -Group instruction provided by verbal instruction, patient participation, and written materials to support subject.  Instructors lead participants through series of stretches that are designed to increase flexibility thus improving mobility.  These stretches are additional exercise for major muscle groups that are typically performed during regular warm up and cool down.   Hands Only CPR Anytime:  -Group instruction provided by verbal instruction, video, patient participation and written materials to support subject.  Instructors co-teach with AHA video for hands only CPR.  Participants get hands on experience with mannequins.   Nutrition I class: Heart Healthy Eating:  -Group instruction provided by PowerPoint slides, verbal discussion, and written materials to support subject matter. The instructor gives an explanation and review of the Therapeutic Lifestyle Changes diet recommendations, which includes a discussion on lipid goals, dietary fat, sodium, fiber, plant stanol/sterol esters, sugar, and the components of a well-balanced, healthy diet.   Nutrition II class: Lifestyle Skills:  -Group instruction provided by PowerPoint slides, verbal discussion, and written materials to support subject matter. The instructor gives an explanation and review of label reading, grocery shopping for heart health, heart healthy recipe modifications, and ways to make healthier choices when eating out.   Diabetes Question & Answer:  -Group instruction provided by PowerPoint slides, verbal discussion, and written materials to support subject matter. The instructor gives an explanation and review of diabetes co-morbidities, pre- and post-prandial blood glucose goals, pre-exercise blood glucose goals, signs, symptoms, and treatment of hypoglycemia and  hyperglycemia, and foot care basics.   Diabetes Blitz:  -Group instruction provided by PowerPoint slides, verbal discussion, and written materials to support subject matter. The instructor gives an explanation and  review of the physiology behind type 1 and type 2 diabetes, diabetes medications and rational behind using different medications, pre- and post-prandial blood glucose recommendations and Hemoglobin A1c goals, diabetes diet, and exercise including blood glucose guidelines for exercising safely.    Portion Distortion:  -Group instruction provided by PowerPoint slides, verbal discussion, written materials, and food models to support subject matter. The instructor gives an explanation of serving size versus portion size, changes in portions sizes over the last 20 years, and what consists of a serving from each food group.   Stress Management:  -Group instruction provided by verbal instruction, video, and written materials to support subject matter.  Instructors review role of stress in heart disease and how to cope with stress positively.     Exercising on Your Own:  -Group instruction provided by verbal instruction, power point, and written materials to support subject.  Instructors discuss benefits of exercise, components of exercise, frequency and intensity of exercise, and end points for exercise.  Also discuss use of nitroglycerin and activating EMS.  Review options of places to exercise outside of rehab.  Review guidelines for sex with heart disease.   Cardiac Drugs I:  -Group instruction provided by verbal instruction and written materials to support subject.  Instructor reviews cardiac drug classes: antiplatelets, anticoagulants, beta blockers, and statins.  Instructor discusses reasons, side effects, and lifestyle considerations for each drug class.   Cardiac Drugs II:  -Group instruction provided by verbal instruction and written materials to support subject.  Instructor  reviews cardiac drug classes: angiotensin converting enzyme inhibitors (ACE-I), angiotensin II receptor blockers (ARBs), nitrates, and calcium channel blockers.  Instructor discusses reasons, side effects, and lifestyle considerations for each drug class. Flowsheet Row CARDIAC REHAB PHASE II EXERCISE from 03/31/2016 in [place]  Date  03/31/16  Educator  pharmacist  Instruction Review Code  2- meets goals/outcomes      Anatomy and Physiology of the Circulatory System:  -Group instruction provided by verbal instruction, video, and written materials to support subject.  Reviews functional anatomy of heart, how it relates to various diagnoses, and what role the heart plays in the overall system.   Knowledge Questionnaire Score:     Knowledge Questionnaire Score - 03/02/16 1354      Knowledge Questionnaire Score   Pre Score 18/24      Core Components/Risk Factors/Patient Goals at Admission:     Personal Goals and Risk Factors at Admission - 03/02/16 1156      Core Components/Risk Factors/Patient Goals on Admission   Hypertension Yes   Intervention Provide education on lifestyle modifcations including regular physical activity/exercise, weight management, moderate sodium restriction and increased consumption of fresh fruit, vegetables, and low fat dairy, alcohol moderation, and smoking cessation.;Monitor prescription use compliance.   Expected Outcomes Short Term: Continued assessment and intervention until BP is < 140/35mm HG in hypertensive participants. < 130/56mm HG in hypertensive participants with diabetes, heart failure or chronic kidney disease.;Long Term: Maintenance of blood pressure at goal levels.   Lipids Yes   Intervention Provide education and support for participant on nutrition & aerobic/resistive exercise along with prescribed medications to achieve LDL 70mg , HDL >40mg .   Expected Outcomes Short Term: Participant states understanding of  desired cholesterol values and is compliant with medications prescribed. Participant is following exercise prescription and nutrition guidelines.;Long Term: Cholesterol controlled with medications as prescribed, with individualized exercise RX and with personalized nutrition plan. Value goals: LDL < 70mg , HDL > 40 mg.  Stress Yes   Intervention Offer individual and/or small group education and counseling on adjustment to heart disease, stress management and health-related lifestyle change. Teach and support self-help strategies.;Refer participants experiencing significant psychosocial distress to appropriate mental health specialists for further evaluation and treatment. When possible, include family members and significant others in education/counseling sessions.   Expected Outcomes Short Term: Participant demonstrates changes in health-related behavior, relaxation and other stress management skills, ability to obtain effective social support, and compliance with psychotropic medications if prescribed.;Long Term: Emotional wellbeing is indicated by absence of clinically significant psychosocial distress or social isolation.   Personal Goal Other Yes   Personal Goal Improve posture. Increase muscle tone. Know capabilities for [facility]   Intervention Provide individulaized home exercise program with parameters for exercise, including stretching, aerobic exercise and resistance training to improve muscle tone and improve fitness.   [facility] Improved muscle tone measured by tricep skinfold measurement and grip strength.      Core Components/Risk Factors/Patient Goals Review:      Goals and Risk Factor Review    Row Name 03/31/16 1448             Core Components/Risk Factors/Patient Goals Review   Personal Goals Review Increase Strength and Stamina;Other       Review Reviewed HEP with pt in which pt will walk at home to improve  strength and stamina       Expected Outcomes Pt will improve cardiovascular fitness and functional capacity          Core Components/Risk Factors/Patient Goals at Discharge (Final Review):      Goals and Risk Factor Review - 03/31/16 1448      Core Components/Risk Factors/Patient Goals Review   Personal Goals Review Increase Strength and Stamina;Other   Review Reviewed HEP with pt in which pt will walk at home to improve strength and stamina   Expected Outcomes Pt will improve cardiovascular fitness and functional capacity      ITP Comments:     ITP Comments    Row Name 03/02/16 1148           ITP Comments Dr. Fransico Him, Medical Director           Comments: Pt is making expected progress toward personal goals after completing 3 sessions. Recommend continued exercise and life style modification education including  stress management and relaxation techniques to decrease cardiac risk profile.

## 2016-03-31 NOTE — Progress Notes (Signed)
Reviewed home exercise with pt today.  Pt plans to walk  for exercise.  Reviewed THR, pulse, RPE, sign and symptoms, and when to call 911 or MD.  Also discussed weather considerations and indoor options.  Pt voiced understanding.    Jasha Hodzic,MS,ACSM RCEP 

## 2016-04-02 ENCOUNTER — Encounter (HOSPITAL_COMMUNITY)
Admission: RE | Admit: 2016-04-02 | Discharge: 2016-04-02 | Disposition: A | Discharge status: Home / Self Care | Payer: Medicare Other | Source: Ambulatory Visit | Attending: Cardiology | Admitting: Cardiology

## 2016-04-02 DIAGNOSIS — Z9889 Other specified postprocedural states: Secondary | ICD-10-CM | POA: Diagnosis not present

## 2016-04-05 ENCOUNTER — Encounter (HOSPITAL_COMMUNITY)
Admission: RE | Admit: 2016-04-05 | Discharge: 2016-04-05 | Disposition: A | Discharge status: Home / Self Care | Payer: Medicare Other | Source: Ambulatory Visit | Attending: Cardiology | Admitting: Cardiology

## 2016-04-05 DIAGNOSIS — Z9889 Other specified postprocedural states: Secondary | ICD-10-CM

## 2016-04-07 ENCOUNTER — Encounter (HOSPITAL_COMMUNITY)
Admission: RE | Admit: 2016-04-07 | Discharge: 2016-04-07 | Disposition: A | Discharge status: Home / Self Care | Payer: Medicare Other | Source: Ambulatory Visit | Attending: Cardiology | Admitting: Cardiology

## 2016-04-07 DIAGNOSIS — Z9889 Other specified postprocedural states: Secondary | ICD-10-CM | POA: Diagnosis not present

## 2016-04-09 ENCOUNTER — Telehealth (HOSPITAL_COMMUNITY): Payer: Self-pay | Admitting: Family Medicine

## 2016-04-09 ENCOUNTER — Encounter (HOSPITAL_COMMUNITY): Payer: Medicare Other

## 2016-04-12 ENCOUNTER — Encounter (HOSPITAL_COMMUNITY): Payer: Medicare Other

## 2016-04-12 ENCOUNTER — Telehealth (HOSPITAL_COMMUNITY): Payer: Self-pay | Admitting: Family Medicine

## 2016-04-12 DIAGNOSIS — H401133 Primary open-angle glaucoma, bilateral, severe stage: Secondary | ICD-10-CM | POA: Diagnosis not present

## 2016-04-14 ENCOUNTER — Telehealth (HOSPITAL_COMMUNITY): Payer: Self-pay | Admitting: Cardiac Rehabilitation

## 2016-04-14 ENCOUNTER — Telehealth (HOSPITAL_COMMUNITY): Payer: Self-pay | Admitting: Family Medicine

## 2016-04-14 ENCOUNTER — Encounter (HOSPITAL_COMMUNITY): Admission: RE | Admit: 2016-04-14 | Payer: Medicare Other | Source: Ambulatory Visit

## 2016-04-14 NOTE — Telephone Encounter (Signed)
pc received from pt reporting absence from cardiac rehab today due to son being in town. Pt c/o dizziness off and on for several days this week, asymptomatic today. Pt instructed to make Dr. Einar Gip aware of her dizziness.  Pt verbalized understanding.

## 2016-04-16 ENCOUNTER — Encounter (HOSPITAL_COMMUNITY): Payer: Medicare Other

## 2016-04-16 DIAGNOSIS — J209 Acute bronchitis, unspecified: Secondary | ICD-10-CM | POA: Diagnosis not present

## 2016-04-19 ENCOUNTER — Telehealth (HOSPITAL_COMMUNITY): Payer: Self-pay | Admitting: Family Medicine

## 2016-04-19 ENCOUNTER — Encounter (HOSPITAL_COMMUNITY): Payer: Medicare Other

## 2016-04-20 DIAGNOSIS — I480 Paroxysmal atrial fibrillation: Secondary | ICD-10-CM | POA: Diagnosis not present

## 2016-04-20 DIAGNOSIS — I10 Essential (primary) hypertension: Secondary | ICD-10-CM | POA: Diagnosis not present

## 2016-04-20 DIAGNOSIS — Z7901 Long term (current) use of anticoagulants: Secondary | ICD-10-CM | POA: Diagnosis not present

## 2016-04-20 DIAGNOSIS — Z9889 Other specified postprocedural states: Secondary | ICD-10-CM | POA: Diagnosis not present

## 2016-04-20 DIAGNOSIS — R0602 Shortness of breath: Secondary | ICD-10-CM | POA: Diagnosis not present

## 2016-04-21 ENCOUNTER — Encounter (HOSPITAL_COMMUNITY): Payer: Medicare Other

## 2016-04-23 ENCOUNTER — Encounter (HOSPITAL_COMMUNITY): Payer: Medicare Other

## 2016-04-26 ENCOUNTER — Encounter (HOSPITAL_COMMUNITY): Payer: Medicare Other

## 2016-04-27 ENCOUNTER — Telehealth (HOSPITAL_COMMUNITY): Payer: Self-pay | Admitting: Cardiac Rehabilitation

## 2016-04-27 NOTE — Telephone Encounter (Signed)
PC to pt to discuss continued absence.  Pt continues to have persistent productive cough, white sputum.  Pt has completed 10 day course of antibiotics without relief.  Pt instructed to contact PCP with symptom update for medical advise. Pt agreeing to that plan and verbalized understanding.

## 2016-04-28 ENCOUNTER — Encounter (HOSPITAL_COMMUNITY)
Admission: RE | Admit: 2016-04-28 | Discharge: 2016-04-28 | Disposition: A | Discharge status: Home / Self Care | Payer: Medicare Other | Source: Ambulatory Visit | Attending: Cardiology | Admitting: Cardiology

## 2016-04-28 DIAGNOSIS — Z9889 Other specified postprocedural states: Secondary | ICD-10-CM | POA: Insufficient documentation

## 2016-04-28 NOTE — Addendum Note (Signed)
Encounter addended by: Pedro Oldenburg D Delainey Winstanley on: 04/28/2016  4:33 PM<BR>    Actions taken: Flowsheet data copied forward, Visit Navigator Flowsheet section accepted

## 2016-04-30 ENCOUNTER — Encounter (HOSPITAL_COMMUNITY)
Admission: RE | Admit: 2016-04-30 | Discharge: 2016-04-30 | Disposition: A | Discharge status: Home / Self Care | Payer: Medicare Other | Source: Ambulatory Visit | Attending: Cardiology | Admitting: Cardiology

## 2016-04-30 NOTE — Progress Notes (Signed)
Cardiac Individual Treatment Plan  Patient Details  Name: Laura Knapp MRN: [id number] Date of Birth: [birth year] Referring Provider:   Flowsheet Row CARDIAC REHAB PHASE II ORIENTATION from 03/02/2016 in [facility]  Referring Provider  Adrian Prows, MD      Initial Encounter Date:  [place] PHASE II ORIENTATION from 03/02/2016 in [facility]  Date  03/02/16  Referring Provider  Adrian Prows, MD      Visit Diagnosis: No diagnosis found.  Patient's Home Medications on Admission:  Current Outpatient Prescriptions:  .  acetaminophen (TYLENOL) 325 MG tablet, Take 650 mg by mouth every 6 (six) hours as needed for moderate pain., Disp: , Rfl:  .  atorvastatin (LIPITOR) 40 MG tablet, Take 40 mg by mouth daily., Disp: , Rfl:  .  brimonidine (ALPHAGAN) 0.2 % ophthalmic solution, Place 1 drop into both eyes 2 (two) times daily., Disp: , Rfl:  .  cyclobenzaprine (FLEXERIL) 10 MG tablet, Take 10 mg by mouth 3 (three) times daily as needed for muscle spasms., Disp: , Rfl:  .  dexlansoprazole (DEXILANT) 60 MG capsule, Take 60 mg by mouth daily., Disp: , Rfl:  .  dorzolamide-timolol (COSOPT) 22.3-6.8 MG/ML ophthalmic solution, Place 1 drop into both eyes 2 (two) times daily., Disp: , Rfl:  .  furosemide (LASIX) 40 MG tablet, Take 40 mg by mouth daily as needed for fluid or edema. , Disp: , Rfl:  .  latanoprost (XALATAN) 0.005 % ophthalmic solution, Place 1 drop into both eyes at bedtime., Disp: , Rfl:  .  metoprolol succinate (TOPROL-XL) 100 MG 24 hr tablet, Take 100 mg by mouth daily. Take with or immediately following a meal., Disp: , Rfl:  .  pilocarpine (PILOCAR) 4 % ophthalmic solution, Place 1 drop into both eyes 3 (three) times daily. , Disp: , Rfl:  .  sotalol (BETAPACE) 80 MG tablet, Take 120 mg by mouth 2 (two) times daily., Disp: , Rfl:  .  traMADol (ULTRAM) 50 MG tablet, Take 50 mg by mouth every 6 (six)  hours as needed for moderate pain or severe pain., Disp: , Rfl:  .  valsartan-hydrochlorothiazide (DIOVAN-HCT) 160-12.5 MG tablet, Take 1 tablet by mouth daily., Disp: , Rfl:  .  warfarin (COUMADIN) 5 MG tablet, Take 5 mg by mouth daily. 2.5mg  daily except 5mg  on MWF, Disp: , Rfl:  .  ZETIA 10 MG tablet, Take 10 mg by mouth daily., Disp: , Rfl:  .  zolpidem (AMBIEN) 10 MG tablet, Take 10 mg by mouth at bedtime as needed for sleep. Takes 1/2 tablet, Disp: , Rfl:   Past Medical History: Past Medical History:  Diagnosis Date  . Arrhythmia    atrial fibrillation  . Atrial fibrillation ([place])   . 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 Heart murmur   . Hypertension   . Pulmonary hypertension     Tobacco Use: History  Smoking Status  . Never Smoker  Smokeless Tobacco  . Former Systems developer  . Quit date: 03/04/1990    Labs: Recent Review Flowsheet Data    Labs for ITP Cardiac and Pulmonary Rehab Latest Ref Rng & Units 10/16/2015 10/16/2015 10/16/2015   PHART 7.350 - 7.450 - - 7.321(L)   PCO2ART 35.0 - 45.0 mmHg - - 45.5(H)   HCO3 20.0 - 24.0 mEq/L 24.2(H) 24.4(H) 23.5   TCO2 0 - 100 mmol/L 26 26 25    ACIDBASEDEF 0.0 - 2.0 mmol/L 2.0  2.0 3.0(H)   O2SAT % 64.0 64.0 91.0      Capillary Blood Glucose: No results found for: GLUCAP   Exercise Target Goals:    Exercise Program Goal: Individual exercise prescription set with THRR, safety & activity barriers. Participant demonstrates ability to understand and report RPE using BORG scale, to self-measure pulse accurately, and to acknowledge the importance of the exercise prescription.  Exercise Prescription Goal: Starting with aerobic activity 30 plus minutes a day, 3 days per week for initial exercise prescription. Provide home exercise prescription and guidelines that participant acknowledges understanding prior to discharge.  Activity Barriers & Risk Stratification:     Activity Barriers & Cardiac Risk Stratification  - 03/02/16 1413      Activity Barriers & Cardiac Risk Stratification   Activity Barriers Back Problems;History of Falls;Other (comment)   Comments Glaucoma surgery, issues with depth perception.   Cardiac Risk Stratification Moderate      6 Minute Walk:     6 Minute Walk    Row Name 03/02/16 1610         6 Minute Walk   Phase Initial     Distance 1453 feet     Walk Time 6 minutes     # of Rest Breaks 0     MPH 2.75     METS 3.66     RPE 11     VO2 Peak 12.81     Symptoms Yes (comment)     Comments Mild SOB.     Resting HR 76 bpm     Resting BP 120/60     Max Ex. HR 100 bpm     Max Ex. BP 144/78     2 Minute Post BP 120/70        Initial Exercise Prescription:     Initial Exercise Prescription - 03/03/16 0700      Date of Initial Exercise RX and Referring Provider   Date 03/02/16   Referring Provider Adrian Prows, MD     Bike   Level 0.8   Minutes 10   METs 3.63     NuStep   Level 2   Minutes 10   METs 2.8     Track   Laps 12   Minutes 10   METs 3.09     Prescription Details   Frequency (times per week) 3   Duration Progress to 30 minutes of continuous aerobic without signs/symptoms of physical distress     Intensity   THRR 40-80% of Max Heartrate 62-123   Ratings of Perceived Exertion 11-13   Perceived Dyspnea 0-4     Progression   Progression Continue to progress workloads to maintain intensity without signs/symptoms of physical distress.     Resistance Training   Training Prescription Yes   Weight 2lbs.   Reps 10-12      Perform Capillary Blood Glucose checks as needed.  Exercise Prescription Changes:     Exercise Prescription Changes    Row Name 03/30/16 1600 03/31/16 1600 04/28/16 1000         Exercise Review   Progression Yes Yes Yes       Response to Exercise   Blood Pressure (Admit) 132/60 153/84 144/76     Blood Pressure (Exercise) 130/80 144/60 150/86     Blood Pressure (Exit) 128/62 128/70 118/68     Heart Rate  (Admit) 57 bpm 58 bpm 69 bpm     Heart Rate (Exercise) 87 bpm 89 bpm 95 bpm  Heart Rate (Exit) 57 bpm 58 bpm 55 bpm     Rating of Perceived Exertion (Exercise) 13 13 13      Symptoms none none none     Comments  - reviewed HEP on 03/31/16 reviewed HEP on 03/31/16     Duration Progress to 30 minutes of continuous aerobic without signs/symptoms of physical distress Progress to 30 minutes of continuous aerobic without signs/symptoms of physical distress Progress to 30 minutes of continuous aerobic without signs/symptoms of physical distress     Intensity THRR unchanged THRR unchanged THRR unchanged       Progression   Average METs 2.8 2.8 3.4       Resistance Training   Training Prescription Yes Yes Yes     Weight 1lb 1lb 3lbs     Reps 10-12 10-12 10-12       Bike   Level 0.8 0.8 0.8     Minutes 15 15 15      METs 3.86 3.86 3.84       NuStep   Level 2 2  -     Minutes 15 15  -     METs 1.8 1.8  -       Track   Laps  -  - 17     Minutes  -  - 15     METs  -  - 2.87       Home Exercise Plan   Plans to continue exercise at  - Home  reviewed on 03/31/16 see progress note Home  reviewed on 03/31/16 see progress note     Frequency  - Add 2 additional days to program exercise sessions. Add 2 additional days to program exercise sessions.        Exercise Comments:     Exercise Comments    Row Name 03/31/16 1447 04/28/16 1630 04/28/16 1631 04/28/16 1632     Exercise Comments Reviewed METs and goals. Pt is toleraring exercise well; will continue to monitor exercise progression Pt has been out sick for 2 weeks with a productive cough Pt has been out sick for 2 weeks with a persistent productive cough/ Pt has been out sick for ~2 weeks with a persistent productive cough/white sputum       Discharge Exercise Prescription (Final Exercise Prescription Changes):     Exercise Prescription Changes - 04/28/16 1000      Exercise Review   Progression Yes     Response to Exercise    Blood Pressure (Admit) 144/76   Blood Pressure (Exercise) 150/86   Blood Pressure (Exit) 118/68   Heart Rate (Admit) 69 bpm   Heart Rate (Exercise) 95 bpm   Heart Rate (Exit) 55 bpm   Rating of Perceived Exertion (Exercise) 13   Symptoms none   Comments reviewed HEP on 03/31/16   Duration Progress to 30 minutes of continuous aerobic without signs/symptoms of physical distress   Intensity THRR unchanged     Progression   Average METs 3.4     Resistance Training   Training Prescription Yes   Weight 3lbs   Reps 10-12     Bike   Level 0.8   Minutes 15   METs 3.84     Track   Laps 17   Minutes 15   METs 2.87     Home Exercise Plan   Plans to continue exercise at Home  reviewed on 03/31/16 see progress note   Frequency Add 2 additional days to program exercise sessions.  Nutrition:  Target Goals: Understanding of nutrition guidelines, daily intake of sodium 1500mg , cholesterol 200mg , calories 30% from fat and 7% or less from saturated fats, daily to have 5 or more servings of fruits and vegetables.  Biometrics:     Pre Biometrics - 03/02/16 1611      Pre Biometrics   Height 5\' 3"  (1.6 m)   Weight 127 lb 3.3 oz (57.7 kg)   Waist Circumference 27.75 inches   Hip Circumference 35.75 inches   Waist to Hip Ratio 0.78 %   BMI (Calculated) 22.6   Triceps Skinfold 17 mm   % Body Fat 31.1 %   Grip Strength 23.5 kg   Flexibility 0 in   Single Leg Stand 6.75 seconds       Nutrition Therapy Plan and Nutrition Goals:     Nutrition Therapy & Goals - 03/05/16 1045      Nutrition Therapy   Diet General, healthful     Personal Nutrition Goals   Personal Goal #1 Wt maintenance while in [place], educate and counsel regarding individualized specific dietary modifications aiming towards targeted core components such as weight, hypertension, lipid management, diabetes, heart failure and other comorbidities.    Expected Outcomes Short Term Goal: Understand basic principles of dietary content, such as calories, fat, sodium, cholesterol and nutrients.;Long Term Goal: Adherence to prescribed nutrition plan.      Nutrition Discharge: Nutrition Scores:     Nutrition Assessments - 03/05/16 1047      MEDFICTS Scores   Pre Score 31      Nutrition Goals Re-Evaluation:   Psychosocial: Target Goals: Acknowledge presence or absence of depression, maximize coping skills, provide positive support system. Participant is able to verbalize types and ability to use techniques and skills needed for reducing stress and depression.  Initial Review & Psychosocial Screening:     Initial Psych Review & Screening - 03/04/16 [place]? Yes     Barriers   Psychosocial barriers to participate in program There are no identifiable barriers or psychosocial needs.     Screening Interventions   Interventions Encouraged to exercise      Quality of Life Scores:     Quality of Life - 03/02/16 1354      Quality of Life Scores   Health/Function Pre 29.2 %   Socioeconomic Pre 30 %   Psych/Spiritual Pre 24.86 %   Family Pre 28.8 %   GLOBAL Pre 28.41 %      PHQ-9: Recent Review Flowsheet Data    Depression screen PHQ 2/9 03/08/2016   Decreased Interest 0   Down, Depressed, Hopeless 0   PHQ - 2 Score 0      Psychosocial Evaluation and Intervention:     Psychosocial Evaluation - 03/30/16 1001      Psychosocial Evaluation & Interventions   Interventions Stress management education;Relaxation education   Comments no psychosocial needs identified, no interventions necessary.    Continued Psychosocial Services Needed No      Psychosocial Re-Evaluation:     Psychosocial Re-Evaluation    [place] Name 03/30/16 1001 04/27/16 1217           Psychosocial Re-Evaluation   Interventions Encouraged to attend Cardiac Rehabilitation for the exercise;Stress management  education;Relaxation education Encouraged to attend Cardiac Rehabilitation for the exercise;Stress management education;Relaxation education      Comments no psychosocial needs identified, no  interventions necessary.  pt with frequent absences due to medication adjustments. pt symptoms are relieved and pt is looking forward to resuming her CR participation.  no psychosocial needs identified, no interventions necessary.  pt with frequent absences due to bronchitis being treated by her PCP.   pt is looking forward to resuming her CR participation once her symptoms have resolved.        Continued Psychosocial Services Needed No No         Vocational Rehabilitation: Provide vocational rehab assistance to qualifying candidates.   Vocational Rehab Evaluation & Intervention:     Vocational Rehab - 03/04/16 1143      Initial Vocational Rehab Evaluation & Intervention   Assessment shows need for Vocational Rehabilitation No  Mrs Clickner is retired      Education: Education Goals: Education classes will be provided on a weekly basis, covering required topics. Participant will state understanding/return demonstration of topics presented.  Learning Barriers/Preferences:     Learning Barriers/Preferences - 03/03/16 0719      Learning Barriers/Preferences   Learning Barriers Hearing   Learning Preferences Skilled Demonstration      Education Topics: Count Your Pulse:  -Group instruction provided by verbal instruction, demonstration, patient participation and written materials to support subject.  Instructors address importance of being able to find your pulse and how to count your pulse when at home without a heart monitor.  Patients get hands on experience counting their pulse with staff help and individually.   Heart Attack, Angina, and Risk Factor Modification:  -Group instruction provided by verbal instruction, video, and written materials to support subject.  Instructors address signs  and symptoms of angina and heart attacks.    Also discuss risk factors for heart disease and how to make changes to improve heart health risk factors.   Functional Fitness:  -Group instruction provided by verbal instruction, demonstration, patient participation, and written materials to support subject.  Instructors address safety measures for doing things around the house.  Discuss how to get up and down off the floor, how to pick things up properly, how to safely get out of a chair without assistance, and balance training. Flowsheet Row CARDIAC REHAB PHASE II EXERCISE from 04/07/2016 in [facility]  Date  04/02/16  Instruction Review Code  2- meets goals/outcomes      Meditation and Mindfulness:  -Group instruction provided by verbal instruction, patient participation, and written materials to support subject.  Instructor addresses importance of mindfulness and meditation practice to help reduce stress and improve awareness.  Instructor also leads participants through a meditation exercise.  Flowsheet Row CARDIAC REHAB PHASE II EXERCISE from 04/07/2016 in [facility]  Date  03/17/16  Educator  [place]  Instruction Review Code  2- meets goals/outcomes      Stretching for Flexibility and Mobility:  -Group instruction provided by verbal instruction, patient participation, and written materials to support subject.  Instructors lead participants through series of stretches that are designed to increase flexibility thus improving mobility.  These stretches are additional exercise for major muscle groups that are typically performed during regular warm up and cool down.   Hands Only CPR Anytime:  -Group instruction provided by verbal instruction, video, patient participation and written materials to support subject.  Instructors co-teach with AHA video for hands only CPR.  Participants get hands on experience with  mannequins.   Nutrition I class: Heart Healthy Eating:  -Group instruction provided by PowerPoint  slides, verbal discussion, and written materials to support subject matter. The instructor gives an explanation and review of the Therapeutic Lifestyle Changes diet recommendations, which includes a discussion on lipid goals, dietary fat, sodium, fiber, plant stanol/sterol esters, sugar, and the components of a well-balanced, healthy diet.   Nutrition II class: Lifestyle Skills:  -Group instruction provided by PowerPoint slides, verbal discussion, and written materials to support subject matter. The instructor gives an explanation and review of label reading, grocery shopping for heart health, heart healthy recipe modifications, and ways to make healthier choices when eating out.   Diabetes Question & Answer:  -Group instruction provided by PowerPoint slides, verbal discussion, and written materials to support subject matter. The instructor gives an explanation and review of diabetes co-morbidities, pre- and post-prandial blood glucose goals, pre-exercise blood glucose goals, signs, symptoms, and treatment of hypoglycemia and hyperglycemia, and foot care basics.   Diabetes Blitz:  -Group instruction provided by PowerPoint slides, verbal discussion, and written materials to support subject matter. The instructor gives an explanation and review of the physiology behind type 1 and type 2 diabetes, diabetes medications and rational behind using different medications, pre- and post-prandial blood glucose recommendations and Hemoglobin A1c goals, diabetes diet, and exercise including blood glucose guidelines for exercising safely.    Portion Distortion:  -Group instruction provided by PowerPoint slides, verbal discussion, written materials, and food models to support subject matter. The instructor gives an explanation of serving size versus portion size, changes in portions sizes over the last 20 years,  and what consists of a serving from each food group.   Stress Management:  -Group instruction provided by verbal instruction, video, and written materials to support subject matter.  Instructors review role of stress in heart disease and how to cope with stress positively.   Flowsheet Row CARDIAC REHAB PHASE II EXERCISE from 04/07/2016 in [facility]  Date  04/07/16  Instruction Review Code  2- meets goals/outcomes      Exercising on Your Own:  -Group instruction provided by verbal instruction, power point, and written materials to support subject.  Instructors discuss benefits of exercise, components of exercise, frequency and intensity of exercise, and end points for exercise.  Also discuss use of nitroglycerin and activating EMS.  Review options of places to exercise outside of rehab.  Review guidelines for sex with heart disease.   Cardiac Drugs I:  -Group instruction provided by verbal instruction and written materials to support subject.  Instructor reviews cardiac drug classes: antiplatelets, anticoagulants, beta blockers, and statins.  Instructor discusses reasons, side effects, and lifestyle considerations for each drug class.   Cardiac Drugs II:  -Group instruction provided by verbal instruction and written materials to support subject.  Instructor reviews cardiac drug classes: angiotensin converting enzyme inhibitors (ACE-I), angiotensin II receptor blockers (ARBs), nitrates, and calcium channel blockers.  Instructor discusses reasons, side effects, and lifestyle considerations for each drug class. Flowsheet Row CARDIAC REHAB PHASE II EXERCISE from 04/07/2016 in [facility]  Date  03/31/16  Educator  pharmacist  Instruction Review Code  2- meets goals/outcomes      Anatomy and Physiology of the Circulatory System:  -Group instruction provided by verbal instruction, video, and written materials to support subject.   Reviews functional anatomy of heart, how it relates to various diagnoses, and what role the heart plays in the overall system.   Knowledge Questionnaire Score:     Knowledge Questionnaire Score - 03/02/16 1354  Knowledge Questionnaire Score   Pre Score 18/24      Core Components/Risk Factors/Patient Goals at Admission:     Personal Goals and Risk Factors at Admission - 03/02/16 1156      Core Components/Risk Factors/Patient Goals on Admission   Hypertension Yes   Intervention Provide education on lifestyle modifcations including regular physical activity/exercise, weight management, moderate sodium restriction and increased consumption of fresh fruit, vegetables, and low fat dairy, alcohol moderation, and smoking cessation.;Monitor prescription use compliance.   Expected Outcomes Short Term: Continued assessment and intervention until BP is < 140/15mm HG in hypertensive participants. < 130/90mm HG in hypertensive participants with diabetes, heart failure or chronic kidney disease.;Long Term: Maintenance of blood pressure at goal levels.   Lipids Yes   Intervention Provide education and support for participant on nutrition & aerobic/resistive exercise along with prescribed medications to achieve LDL 70mg , HDL >40mg .   Expected Outcomes Short Term: Participant states understanding of desired cholesterol values and is compliant with medications prescribed. Participant is following exercise prescription and nutrition guidelines.;Long Term: Cholesterol controlled with medications as prescribed, with individualized exercise RX and with personalized nutrition plan. Value goals: LDL < 70mg , HDL > 40 mg.   Stress Yes   Intervention Offer individual and/or small group education and counseling on adjustment to heart disease, stress management and health-related lifestyle change. Teach and support self-help strategies.;Refer participants experiencing significant psychosocial distress to  appropriate mental health specialists for further evaluation and treatment. When possible, include family members and significant others in education/counseling sessions.   Expected Outcomes Short Term: Participant demonstrates changes in health-related behavior, relaxation and other stress management skills, ability to obtain effective social support, and compliance with psychotropic medications if prescribed.;Long Term: Emotional wellbeing is indicated by absence of clinically significant psychosocial distress or social isolation.   Personal Goal Other Yes   Personal Goal Improve posture. Increase muscle tone. Know capabilities for [facility]   Intervention Provide individulaized home exercise program with parameters for exercise, including stretching, aerobic exercise and resistance training to improve muscle tone and improve fitness.   [facility] Improved muscle tone measured by tricep skinfold measurement and grip strength.      Core Components/Risk Factors/Patient Goals Review:      Goals and Risk Factor Review    Row Name 03/31/16 1448             Core Components/Risk Factors/Patient Goals Review   Personal Goals Review Increase Strength and Stamina;Other       Review Reviewed HEP with pt in which pt will walk at home to improve strength and stamina       Expected Outcomes Pt will improve cardiovascular fitness and functional capacity          Core Components/Risk Factors/Patient Goals at Discharge (Final Review):      Goals and Risk Factor Review - 03/31/16 1448      Core Components/Risk Factors/Patient Goals Review   Personal Goals Review Increase Strength and Stamina;Other   Review Reviewed HEP with pt in which pt will walk at home to improve strength and stamina   Expected Outcomes Pt will improve cardiovascular fitness and functional capacity      ITP Comments:     ITP Comments    Row Name 03/02/16 1148            ITP Comments Dr. Fransico Him, Medical Director  Comments: Pt is  Not making expected progress toward personal goals after completing 8 sessions due to frequent absences.  Pt is looking forward to being able to return to cardiac rehab once her current respiratory illness resolves.  Recommend continued exercise and life style modification education including  stress management and relaxation techniques to decrease cardiac risk profile.

## 2016-05-03 ENCOUNTER — Encounter (HOSPITAL_COMMUNITY): Payer: Medicare Other

## 2016-05-04 DIAGNOSIS — Z7901 Long term (current) use of anticoagulants: Secondary | ICD-10-CM | POA: Diagnosis not present

## 2016-05-05 ENCOUNTER — Encounter (HOSPITAL_COMMUNITY): Payer: Medicare Other

## 2016-05-07 ENCOUNTER — Encounter (HOSPITAL_COMMUNITY): Payer: Medicare Other

## 2016-05-07 ENCOUNTER — Telehealth (HOSPITAL_COMMUNITY): Payer: Self-pay | Admitting: Family Medicine

## 2016-05-10 ENCOUNTER — Encounter (HOSPITAL_COMMUNITY): Payer: Medicare Other

## 2016-05-12 ENCOUNTER — Encounter (HOSPITAL_COMMUNITY): Payer: Medicare Other

## 2016-05-14 ENCOUNTER — Telehealth (HOSPITAL_COMMUNITY): Payer: Self-pay | Admitting: Family Medicine

## 2016-05-14 ENCOUNTER — Encounter (HOSPITAL_COMMUNITY): Payer: Medicare Other

## 2016-05-14 NOTE — Telephone Encounter (Signed)
Pt wants to wait till first wk of Jan due to her body strength... KJ sent msg to RN to return pt's call.Marland KitchenMarland Kitchen

## 2016-05-19 ENCOUNTER — Encounter (HOSPITAL_COMMUNITY): Payer: Medicare Other

## 2016-05-19 DIAGNOSIS — Z7901 Long term (current) use of anticoagulants: Secondary | ICD-10-CM | POA: Diagnosis not present

## 2016-05-21 ENCOUNTER — Encounter (HOSPITAL_COMMUNITY): Payer: Medicare Other

## 2016-05-26 ENCOUNTER — Encounter (HOSPITAL_COMMUNITY)
Admission: RE | Admit: 2016-05-26 | Discharge: 2016-05-26 | Disposition: A | Discharge status: Home / Self Care | Payer: Medicare Other | Source: Ambulatory Visit | Attending: Cardiology | Admitting: Cardiology

## 2016-05-26 DIAGNOSIS — Z9889 Other specified postprocedural states: Secondary | ICD-10-CM | POA: Diagnosis not present

## 2016-05-26 NOTE — Progress Notes (Signed)
Pt returned to rehab today after extended absence due to bronchitis.  Pt reports her chest symptoms have greatly improved.  Pt tolerated light exercise without difficulty.  Workloads decreased to accomodate pt current exertional level.  Will continue to monitor and increase workloads as appropriate.

## 2016-05-28 ENCOUNTER — Encounter (HOSPITAL_COMMUNITY)
Admission: RE | Admit: 2016-05-28 | Discharge: 2016-05-28 | Disposition: A | Discharge status: Home / Self Care | Payer: Medicare Other | Source: Ambulatory Visit | Attending: Cardiology | Admitting: Cardiology

## 2016-05-28 DIAGNOSIS — Z9889 Other specified postprocedural states: Secondary | ICD-10-CM | POA: Diagnosis not present

## 2016-05-28 NOTE — Progress Notes (Signed)
Cardiac Individual Treatment Plan  Patient Details  Name: Laura Knapp MRN: [id number] Date of Birth: 01-May-1951 Referring Provider:   Flowsheet Row CARDIAC REHAB PHASE II ORIENTATION from 03/02/2016 in [place] [facility]  Referring Provider  Adrian Prows, MD      Initial Encounter Date:  [place] PHASE II ORIENTATION from 03/02/2016 in [facility]  Date  03/02/16  Referring Provider  Adrian Prows, MD      Visit Diagnosis: No diagnosis found.  Patient's Home Medications on Admission:  Current Outpatient Prescriptions:  .  acetaminophen (TYLENOL) 325 MG tablet, Take 650 mg by mouth every 6 (six) hours as needed for moderate pain., Disp: , Rfl:  .  atorvastatin (LIPITOR) 40 MG tablet, Take 40 mg by mouth daily., Disp: , Rfl:  .  brimonidine (ALPHAGAN) 0.2 % ophthalmic solution, Place 1 drop into both eyes 2 (two) times daily., Disp: , Rfl:  .  cyclobenzaprine (FLEXERIL) 10 MG tablet, Take 10 mg by mouth 3 (three) times daily as needed for muscle spasms., Disp: , Rfl:  .  dexlansoprazole (DEXILANT) 60 MG capsule, Take 60 mg by mouth daily., Disp: , Rfl:  .  dorzolamide-timolol (COSOPT) 22.3-6.8 MG/ML ophthalmic solution, Place 1 drop into both eyes 2 (two) times daily., Disp: , Rfl:  .  furosemide (LASIX) 40 MG tablet, Take 40 mg by mouth daily as needed for fluid or edema. , Disp: , Rfl:  .  latanoprost (XALATAN) 0.005 % ophthalmic solution, Place 1 drop into both eyes at bedtime., Disp: , Rfl:  .  metoprolol succinate (TOPROL-XL) 100 MG 24 hr tablet, Take 100 mg by mouth daily. Take with or immediately following a meal., Disp: , Rfl:  .  pilocarpine (PILOCAR) 4 % ophthalmic solution, Place 1 drop into both eyes 3 (three) times daily. , Disp: , Rfl:  .  sotalol (BETAPACE) 80 MG tablet, Take 120 mg by mouth 2 (two) times daily., Disp: , Rfl:  .  traMADol (ULTRAM) 50 MG tablet, Take 50 mg by mouth every 6 (six)  hours as needed for moderate pain or severe pain., Disp: , Rfl:  .  valsartan-hydrochlorothiazide (DIOVAN-HCT) 160-12.5 MG tablet, Take 1 tablet by mouth daily., Disp: , Rfl:  .  warfarin (COUMADIN) 5 MG tablet, Take 5 mg by mouth daily. 2.5mg  daily except 5mg  on MWF, Disp: , Rfl:  .  ZETIA 10 MG tablet, Take 10 mg by mouth daily., Disp: , Rfl:  .  zolpidem (AMBIEN) 10 MG tablet, Take 10 mg by mouth at bedtime as needed for sleep. Takes 1/2 tablet, Disp: , Rfl:   Past Medical History: Past Medical History:  Diagnosis Date  . Arrhythmia    atrial fibrillation  . Atrial fibrillation ([place])   . 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 Heart murmur   . Hypertension   . Pulmonary hypertension     Tobacco Use: History  Smoking Status  . Never Smoker  Smokeless Tobacco  . Former Systems developer  . Quit date: 03/04/1990    Labs: Recent Review Flowsheet Data    Labs for ITP Cardiac and Pulmonary Rehab Latest Ref Rng & Units 10/16/2015 10/16/2015 10/16/2015   PHART 7.350 - 7.450 - - 7.321(L)   PCO2ART 35.0 - 45.0 mmHg - - 45.5(H)   HCO3 20.0 - 24.0 mEq/L 24.2(H) 24.4(H) 23.5   TCO2 0 - 100 mmol/L 26 26 25    ACIDBASEDEF 0.0 - 2.0 mmol/L 2.0  2.0 3.0(H)   O2SAT % 64.0 64.0 91.0      Capillary Blood Glucose: No results found for: GLUCAP   Exercise Target Goals:    Exercise Program Goal: Individual exercise prescription set with THRR, safety & activity barriers. Participant demonstrates ability to understand and report RPE using BORG scale, to self-measure pulse accurately, and to acknowledge the importance of the exercise prescription.  Exercise Prescription Goal: Starting with aerobic activity 30 plus minutes a day, 3 days per week for initial exercise prescription. Provide home exercise prescription and guidelines that participant acknowledges understanding prior to discharge.  Activity Barriers & Risk Stratification:     Activity Barriers & Cardiac Risk Stratification  - 03/02/16 1413      Activity Barriers & Cardiac Risk Stratification   Activity Barriers Back Problems;History of Falls;Other (comment)   Comments Glaucoma surgery, issues with depth perception.   Cardiac Risk Stratification Moderate      6 Minute Walk:     6 Minute Walk    Row Name 03/02/16 1610         6 Minute Walk   Phase Initial     Distance 1453 feet     Walk Time 6 minutes     # of Rest Breaks 0     MPH 2.75     METS 3.66     RPE 11     VO2 Peak 12.81     Symptoms Yes (comment)     Comments Mild SOB.     Resting HR 76 bpm     Resting BP 120/60     Max Ex. HR 100 bpm     Max Ex. BP 144/78     2 Minute Post BP 120/70        Initial Exercise Prescription:     Initial Exercise Prescription - 03/03/16 0700      Date of Initial Exercise RX and Referring Provider   Date 03/02/16   Referring Provider Adrian Prows, MD     Bike   Level 0.8   Minutes 10   METs 3.63     NuStep   Level 2   Minutes 10   METs 2.8     Track   Laps 12   Minutes 10   METs 3.09     Prescription Details   Frequency (times per week) 3   Duration Progress to 30 minutes of continuous aerobic without signs/symptoms of physical distress     Intensity   THRR 40-80% of Max Heartrate 62-123   Ratings of Perceived Exertion 11-13   Perceived Dyspnea 0-4     Progression   Progression Continue to progress workloads to maintain intensity without signs/symptoms of physical distress.     Resistance Training   Training Prescription Yes   Weight 2lbs.   Reps 10-12      Perform Capillary Blood Glucose checks as needed.  Exercise Prescription Changes:     Exercise Prescription Changes    Row Name 03/30/16 1600 03/31/16 1600 04/28/16 1000 05/27/16 1500       Exercise Review   Progression Yes Yes Yes Yes      Response to Exercise   Blood Pressure (Admit) 132/60 153/84 144/76 144/72    Blood Pressure (Exercise) 130/80 144/60 150/86 150/80    Blood Pressure (Exit) 128/62 128/70  118/68 124/64    Heart Rate (Admit) 57 bpm 58 bpm 69 bpm 61 bpm    Heart Rate (Exercise) 87 bpm 89 bpm 95 bpm 91  bpm    Heart Rate (Exit) 57 bpm 58 bpm 55 bpm 60 bpm    Rating of Perceived Exertion (Exercise) 13 13 13 13     Symptoms none none none decreased WL due to absence. There was a layoff of~ 6 weeks.    Comments  - reviewed HEP on 03/31/16 reviewed HEP on 03/31/16 reviewed HEP on 03/31/16    Duration Progress to 30 minutes of continuous aerobic without signs/symptoms of physical distress Progress to 30 minutes of continuous aerobic without signs/symptoms of physical distress Progress to 30 minutes of continuous aerobic without signs/symptoms of physical distress Progress to 30 minutes of continuous aerobic without signs/symptoms of physical distress    Intensity THRR unchanged THRR unchanged THRR unchanged THRR unchanged      Progression   Average METs 2.8 2.8 3.4 2.9      Resistance Training   Training Prescription Yes Yes Yes Yes    Weight 1lb 1lb 3lbs 3lbs    Reps 10-12 10-12 10-12 10-12      Bike   Level 0.8 0.8 0.8 0.5  WL decreased due to layoff of >4weeks    Minutes 15 15 15 15     METs 3.86 3.86 3.84 3.84      NuStep   Level 2 2  - 3    Minutes 15 15  - 15    METs 1.8 1.8  - 2      Track   Laps  -  - 17  -    Minutes  -  - 15  -    METs  -  - 2.87  -      Home Exercise Plan   Plans to continue exercise at  - Home  reviewed on 03/31/16 see progress note Home  reviewed on 03/31/16 see progress note Home  reviewed on 03/31/16 see progress note    Frequency  - Add 2 additional days to program exercise sessions. Add 2 additional days to program exercise sessions. Add 2 additional days to program exercise sessions.       Exercise Comments:     Exercise Comments    Row Name 03/31/16 1447 04/28/16 1630 04/28/16 1631 04/28/16 1632 05/26/16 1726   Exercise Comments Reviewed METs and goals. Pt is toleraring exercise well; will continue to monitor exercise progression Pt  has been out sick for 2 weeks with a productive cough Pt has been out sick for 2 weeks with a persistent productive cough/ Pt has been out sick for ~2 weeks with a persistent productive cough/white sputum Reviewed METS and goals. Pt returned to cardiac rehab after recovering from bronchitis. Adjusted WL per patient tolerance due to not being active for ~4 weeks.      Discharge Exercise Prescription (Final Exercise Prescription Changes):     Exercise Prescription Changes - 05/27/16 1500      Exercise Review   Progression Yes     Response to Exercise   Blood Pressure (Admit) 144/72   Blood Pressure (Exercise) 150/80   Blood Pressure (Exit) 124/64   Heart Rate (Admit) 61 bpm   Heart Rate (Exercise) 91 bpm   Heart Rate (Exit) 60 bpm   Rating of Perceived Exertion (Exercise) 13   Symptoms decreased WL due to absence. There was a layoff of~ 6 weeks.   Comments reviewed HEP on 03/31/16   Duration Progress to 30 minutes of continuous aerobic without signs/symptoms of physical distress   Intensity THRR unchanged     Progression  Average METs 2.9     Resistance Training   Training Prescription Yes   Weight 3lbs   Reps 10-12     Bike   Level 0.5  WL decreased due to layoff of >4weeks   Minutes 15   METs 3.84     NuStep   Level 3   Minutes 15   METs 2     Home Exercise Plan   Plans to continue exercise at Home  reviewed on 03/31/16 see progress note   Frequency Add 2 additional days to program exercise sessions.      Nutrition:  Target Goals: Understanding of nutrition guidelines, daily intake of sodium 1500mg , cholesterol 200mg , calories 30% from fat and 7% or less from saturated fats, daily to have 5 or more servings of fruits and vegetables.  Biometrics:     Pre Biometrics - 03/02/16 1611      Pre Biometrics   Height 5\' 3"  (1.6 m)   Weight 127 lb 3.3 oz (57.7 kg)   Waist Circumference 27.75 inches   Hip Circumference 35.75 inches   Waist to Hip Ratio 0.78 %    BMI (Calculated) 22.6   Triceps Skinfold 17 mm   % Body Fat 31.1 %   Grip Strength 23.5 kg   Flexibility 0 in   Single Leg Stand 6.75 seconds       Nutrition Therapy Plan and Nutrition Goals:     Nutrition Therapy & Goals - 03/05/16 1045      Nutrition Therapy   Diet General, healthful     Personal Nutrition Goals   Personal Goal #1 Wt maintenance while in [facility], educate and counsel regarding individualized specific dietary modifications aiming towards targeted core components such as weight, hypertension, lipid management, diabetes, heart failure and other comorbidities.   Expected Outcomes Short Term Goal: Understand basic principles of dietary content, such as calories, fat, sodium, cholesterol and nutrients.;Long Term Goal: Adherence to prescribed nutrition plan.      Nutrition Discharge: Nutrition Scores:     Nutrition Assessments - 03/05/16 1047      MEDFICTS Scores   Pre Score 31      Nutrition Goals Re-Evaluation:   Psychosocial: Target Goals: Acknowledge presence or absence of depression, maximize coping skills, provide positive support system. Participant is able to verbalize types and ability to use techniques and skills needed for reducing stress and depression.  Initial Review & Psychosocial Screening:     Initial Psych Review & Screening - 03/04/16 [place]? Yes     Barriers   Psychosocial barriers to participate in program There are no identifiable barriers or psychosocial needs.     Screening Interventions   Interventions Encouraged to exercise      Quality of Life Scores:     Quality of Life - 03/02/16 1354      Quality of Life Scores   Health/Function Pre 29.2 %   Socioeconomic Pre 30 %   Psych/Spiritual Pre 24.86 %   Family Pre 28.8 %   GLOBAL Pre 28.41 %      PHQ-9: Recent Review Flowsheet Data    Depression screen PHQ 2/9  03/08/2016   Decreased Interest 0   Down, Depressed, Hopeless 0   PHQ - 2 Score 0      Psychosocial Evaluation and Intervention:     Psychosocial Evaluation - 03/30/16 1001  Psychosocial Evaluation & Interventions   Interventions Stress management education;Relaxation education   Comments no psychosocial needs identified, no interventions necessary.    Continued Psychosocial Services Needed No      Psychosocial Re-Evaluation:     Psychosocial Re-Evaluation    Row Name 03/30/16 1001 04/27/16 1217 05/28/16 0728         Psychosocial Re-Evaluation   Interventions Encouraged to attend Cardiac Rehabilitation for the exercise;Stress management education;Relaxation education Encouraged to attend Cardiac Rehabilitation for the exercise;Stress management education;Relaxation education Encouraged to attend Cardiac Rehabilitation for the exercise;Stress management education;Relaxation education     Comments no psychosocial needs identified, no interventions necessary.  pt with frequent absences due to medication adjustments. pt symptoms are relieved and pt is looking forward to resuming her CR participation.  no psychosocial needs identified, no interventions necessary.  pt with frequent absences due to bronchitis being treated by her PCP.   pt is looking forward to resuming her CR participation once her symptoms have resolved.   no psychosocial needs identified, no interventions necessary.  pt returned after extended absence for bronchitis.  pt is deconditioned as a result.      Continued Psychosocial Services Needed No No No        Vocational Rehabilitation: Provide vocational rehab assistance to qualifying candidates.   Vocational Rehab Evaluation & Intervention:     Vocational Rehab - 03/04/16 1143      Initial Vocational Rehab Evaluation & Intervention   Assessment shows need for Vocational Rehabilitation No  Mrs Shu is retired      Education: Education Goals:  Education classes will be provided on a weekly basis, covering required topics. Participant will state understanding/return demonstration of topics presented.  Learning Barriers/Preferences:     Learning Barriers/Preferences - 03/03/16 0719      Learning Barriers/Preferences   Learning Barriers Hearing   Learning Preferences Skilled Demonstration      Education Topics: Count Your Pulse:  -Group instruction provided by verbal instruction, demonstration, patient participation and written materials to support subject.  Instructors address importance of being able to find your pulse and how to count your pulse when at home without a heart monitor.  Patients get hands on experience counting their pulse with staff help and individually.   Heart Attack, Angina, and Risk Factor Modification:  -Group instruction provided by verbal instruction, video, and written materials to support subject.  Instructors address signs and symptoms of angina and heart attacks.    Also discuss risk factors for heart disease and how to make changes to improve heart health risk factors.   Functional Fitness:  -Group instruction provided by verbal instruction, demonstration, patient participation, and written materials to support subject.  Instructors address safety measures for doing things around the house.  Discuss how to get up and down off the floor, how to pick things up properly, how to safely get out of a chair without assistance, and balance training. Flowsheet Row CARDIAC REHAB PHASE II EXERCISE from 04/07/2016 in [facility]  Date  04/02/16  Instruction Review Code  2- meets goals/outcomes      Meditation and Mindfulness:  -Group instruction provided by verbal instruction, patient participation, and written materials to support subject.  Instructor addresses importance of mindfulness and meditation practice to help reduce stress and improve awareness.  Instructor also leads  participants through a meditation exercise.  Flowsheet Row CARDIAC REHAB PHASE II EXERCISE from 04/07/2016 in [facility]  Date  03/17/16  Educator  [place]  Instruction Review Code  2- meets goals/outcomes      Stretching for Flexibility and Mobility:  -Group instruction provided by verbal instruction, patient participation, and written materials to support subject.  Instructors lead participants through series of stretches that are designed to increase flexibility thus improving mobility.  These stretches are additional exercise for major muscle groups that are typically performed during regular warm up and cool down.   Hands Only CPR Anytime:  -Group instruction provided by verbal instruction, video, patient participation and written materials to support subject.  Instructors co-teach with AHA video for hands only CPR.  Participants get hands on experience with mannequins.   Nutrition I class: Heart Healthy Eating:  -Group instruction provided by PowerPoint slides, verbal discussion, and written materials to support subject matter. The instructor gives an explanation and review of the Therapeutic Lifestyle Changes diet recommendations, which includes a discussion on lipid goals, dietary fat, sodium, fiber, plant stanol/sterol esters, sugar, and the components of a well-balanced, healthy diet.   Nutrition II class: Lifestyle Skills:  -Group instruction provided by PowerPoint slides, verbal discussion, and written materials to support subject matter. The instructor gives an explanation and review of label reading, grocery shopping for heart health, heart healthy recipe modifications, and ways to make healthier choices when eating out.   Diabetes Question & Answer:  -Group instruction provided by PowerPoint slides, verbal discussion, and written materials to support subject matter. The instructor gives an explanation and review of diabetes co-morbidities,  pre- and post-prandial blood glucose goals, pre-exercise blood glucose goals, signs, symptoms, and treatment of hypoglycemia and hyperglycemia, and foot care basics.   Diabetes Blitz:  -Group instruction provided by PowerPoint slides, verbal discussion, and written materials to support subject matter. The instructor gives an explanation and review of the physiology behind type 1 and type 2 diabetes, diabetes medications and rational behind using different medications, pre- and post-prandial blood glucose recommendations and Hemoglobin A1c goals, diabetes diet, and exercise including blood glucose guidelines for exercising safely.    Portion Distortion:  -Group instruction provided by PowerPoint slides, verbal discussion, written materials, and food models to support subject matter. The instructor gives an explanation of serving size versus portion size, changes in portions sizes over the last 20 years, and what consists of a serving from each food group.   Stress Management:  -Group instruction provided by verbal instruction, video, and written materials to support subject matter.  Instructors review role of stress in heart disease and how to cope with stress positively.   Flowsheet Row CARDIAC REHAB PHASE II EXERCISE from 04/07/2016 in [place]  Date  04/07/16  Instruction Review Code  2- meets goals/outcomes      Exercising on Your Own:  -Group instruction provided by verbal instruction, power point, and written materials to support subject.  Instructors discuss benefits of exercise, components of exercise, frequency and intensity of exercise, and end points for exercise.  Also discuss use of nitroglycerin and activating EMS.  Review options of places to exercise outside of rehab.  Review guidelines for sex with heart disease.   Cardiac Drugs I:  -Group instruction provided by verbal instruction and written materials to support subject.  Instructor reviews  cardiac drug classes: antiplatelets, anticoagulants, beta blockers, and statins.  Instructor discusses reasons, side effects, and lifestyle considerations for each drug class.   Cardiac Drugs II:  -Group instruction provided by verbal instruction and written materials to support subject.  Instructor reviews cardiac drug classes: angiotensin converting enzyme inhibitors (ACE-I), angiotensin II receptor blockers (ARBs), nitrates, and calcium channel blockers.  Instructor discusses reasons, side effects, and lifestyle considerations for each drug class. Flowsheet Row CARDIAC REHAB PHASE II EXERCISE from 04/07/2016 in [facility]  Date  03/31/16  Educator  pharmacist  Instruction Review Code  2- meets goals/outcomes      Anatomy and Physiology of the Circulatory System:  -Group instruction provided by verbal instruction, video, and written materials to support subject.  Reviews functional anatomy of heart, how it relates to various diagnoses, and what role the heart plays in the overall system.   Knowledge Questionnaire Score:     Knowledge Questionnaire Score - 03/02/16 1354      Knowledge Questionnaire Score   Pre Score 18/24      Core Components/Risk Factors/Patient Goals at Admission:     Personal Goals and Risk Factors at Admission - 03/02/16 1156      Core Components/Risk Factors/Patient Goals on Admission   Hypertension Yes   Intervention Provide education on lifestyle modifcations including regular physical activity/exercise, weight management, moderate sodium restriction and increased consumption of fresh fruit, vegetables, and low fat dairy, alcohol moderation, and smoking cessation.;Monitor prescription use compliance.   Expected Outcomes Short Term: Continued assessment and intervention until BP is < 140/61mm HG in hypertensive participants. < 130/56mm HG in hypertensive participants with diabetes, heart failure or chronic kidney disease.;Long  Term: Maintenance of blood pressure at goal levels.   Lipids Yes   Intervention Provide education and support for participant on nutrition & aerobic/resistive exercise along with prescribed medications to achieve LDL 70mg , HDL >40mg .   Expected Outcomes Short Term: Participant states understanding of desired cholesterol values and is compliant with medications prescribed. Participant is following exercise prescription and nutrition guidelines.;Long Term: Cholesterol controlled with medications as prescribed, with individualized exercise RX and with personalized nutrition plan. Value goals: LDL < 70mg , HDL > 40 mg.   Stress Yes   Intervention Offer individual and/or small group education and counseling on adjustment to heart disease, stress management and health-related lifestyle change. Teach and support self-help strategies.;Refer participants experiencing significant psychosocial distress to appropriate mental health specialists for further evaluation and treatment. When possible, include family members and significant others in education/counseling sessions.   Expected Outcomes Short Term: Participant demonstrates changes in health-related behavior, relaxation and other stress management skills, ability to obtain effective social support, and compliance with psychotropic medications if prescribed.;Long Term: Emotional wellbeing is indicated by absence of clinically significant psychosocial distress or social isolation.   Personal Goal Other Yes   Personal Goal Improve posture. Increase muscle tone. Know capabilities for [facility]   Intervention Provide individulaized home exercise program with parameters for exercise, including stretching, aerobic exercise and resistance training to improve muscle tone and improve fitness.   [facility] Improved muscle tone measured by tricep skinfold measurement and grip strength.      Core  Components/Risk Factors/Patient Goals Review:      Goals and Risk Factor Review    Row Name 03/31/16 1448 05/26/16 1728           Core Components/Risk Factors/Patient Goals Review   Personal Goals Review Increase Strength and Stamina;Other Other      Review Reviewed HEP with pt in which pt will walk at home to improve strength and stamina Reduced WL due to being inactive for ~ 4 weeks.  Pt stated having better postural awareness. Discussed adding weights to exercise regimen to help improve postural muscles..      Expected Outcomes Pt will improve cardiovascular fitness and functional capacity Pt will get back into exercise routine and improve in strength.         Core Components/Risk Factors/Patient Goals at Discharge (Final Review):      Goals and Risk Factor Review - 05/26/16 1728      Core Components/Risk Factors/Patient Goals Review   Personal Goals Review Other   Review Reduced WL due to being inactive for ~ 4 weeks. Pt stated having better postural awareness. Discussed adding weights to exercise regimen to help improve postural muscles..   Expected Outcomes Pt will get back into exercise routine and improve in strength.      ITP Comments:     ITP Comments    Row Name 03/02/16 1148           ITP Comments Dr. Fransico Him, Medical Director           Comments: Pt is not making expected progress toward personal goals after completing  sessions. Pt with extended absence due to bronchitis.  Pt workloads are reset to beginning levels.  Pt is eager to restart rehab activities and begin rebuilding her strength asn stamina.  Recommend continued exercise and life style modification education including  stress management and relaxation techniques to decrease cardiac risk profile.

## 2016-05-31 ENCOUNTER — Encounter (HOSPITAL_COMMUNITY)
Admission: RE | Admit: 2016-05-31 | Discharge: 2016-05-31 | Disposition: A | Discharge status: Home / Self Care | Payer: Medicare Other | Source: Ambulatory Visit | Attending: Cardiology | Admitting: Cardiology

## 2016-05-31 DIAGNOSIS — Z9889 Other specified postprocedural states: Secondary | ICD-10-CM

## 2016-06-01 DIAGNOSIS — Z7901 Long term (current) use of anticoagulants: Secondary | ICD-10-CM | POA: Diagnosis not present

## 2016-06-02 ENCOUNTER — Encounter (HOSPITAL_COMMUNITY)
Admission: RE | Admit: 2016-06-02 | Discharge: 2016-06-02 | Disposition: A | Discharge status: Home / Self Care | Payer: Medicare Other | Source: Ambulatory Visit | Attending: Cardiology | Admitting: Cardiology

## 2016-06-02 DIAGNOSIS — Z9889 Other specified postprocedural states: Secondary | ICD-10-CM | POA: Diagnosis not present

## 2016-06-04 ENCOUNTER — Encounter (HOSPITAL_COMMUNITY)
Admission: RE | Admit: 2016-06-04 | Discharge: 2016-06-04 | Disposition: A | Discharge status: Home / Self Care | Payer: Medicare Other | Source: Ambulatory Visit | Attending: Cardiology | Admitting: Cardiology

## 2016-06-04 DIAGNOSIS — Z9889 Other specified postprocedural states: Secondary | ICD-10-CM

## 2016-06-07 ENCOUNTER — Encounter (HOSPITAL_COMMUNITY)
Admission: RE | Admit: 2016-06-07 | Discharge: 2016-06-07 | Disposition: A | Discharge status: Home / Self Care | Payer: Medicare Other | Source: Ambulatory Visit | Attending: Cardiology | Admitting: Cardiology

## 2016-06-07 DIAGNOSIS — Z9889 Other specified postprocedural states: Secondary | ICD-10-CM

## 2016-06-09 ENCOUNTER — Encounter (HOSPITAL_COMMUNITY): Payer: Medicare Other

## 2016-06-11 ENCOUNTER — Encounter (HOSPITAL_COMMUNITY): Payer: Medicare Other

## 2016-06-11 DIAGNOSIS — Z7901 Long term (current) use of anticoagulants: Secondary | ICD-10-CM | POA: Diagnosis not present

## 2016-06-14 ENCOUNTER — Encounter (HOSPITAL_COMMUNITY)
Admission: RE | Admit: 2016-06-14 | Discharge: 2016-06-14 | Disposition: A | Discharge status: Home / Self Care | Payer: Medicare Other | Source: Ambulatory Visit | Attending: Cardiology | Admitting: Cardiology

## 2016-06-14 DIAGNOSIS — Z9889 Other specified postprocedural states: Secondary | ICD-10-CM

## 2016-06-16 ENCOUNTER — Encounter (HOSPITAL_COMMUNITY)
Admission: RE | Admit: 2016-06-16 | Discharge: 2016-06-16 | Disposition: A | Discharge status: Home / Self Care | Payer: Medicare Other | Source: Ambulatory Visit | Attending: Cardiology | Admitting: Cardiology

## 2016-06-16 DIAGNOSIS — Z9889 Other specified postprocedural states: Secondary | ICD-10-CM | POA: Diagnosis not present

## 2016-06-18 ENCOUNTER — Encounter (HOSPITAL_COMMUNITY)
Admission: RE | Admit: 2016-06-18 | Discharge: 2016-06-18 | Disposition: A | Discharge status: Home / Self Care | Payer: Medicare Other | Source: Ambulatory Visit | Attending: Cardiology | Admitting: Cardiology

## 2016-06-18 DIAGNOSIS — Z9889 Other specified postprocedural states: Secondary | ICD-10-CM

## 2016-06-21 ENCOUNTER — Encounter (HOSPITAL_COMMUNITY)
Admission: RE | Admit: 2016-06-21 | Discharge: 2016-06-21 | Disposition: A | Discharge status: Home / Self Care | Payer: Medicare Other | Source: Ambulatory Visit | Attending: Cardiology | Admitting: Cardiology

## 2016-06-21 ENCOUNTER — Telehealth (HOSPITAL_COMMUNITY): Payer: Self-pay | Admitting: Family Medicine

## 2016-06-23 ENCOUNTER — Encounter (HOSPITAL_COMMUNITY)
Admission: RE | Admit: 2016-06-23 | Discharge: 2016-06-23 | Disposition: A | Discharge status: Home / Self Care | Payer: Medicare Other | Source: Ambulatory Visit | Attending: Cardiology | Admitting: Cardiology

## 2016-06-23 DIAGNOSIS — Z9889 Other specified postprocedural states: Secondary | ICD-10-CM

## 2016-06-23 NOTE — Progress Notes (Signed)
Cardiac Individual Treatment Plan  Patient Details  Name: Laura Knapp MRN: [id number] Date of Birth: 01-30-1951 Referring Provider:   Flowsheet Row CARDIAC REHAB PHASE II ORIENTATION from 03/02/2016 in [facility]  Referring Provider  Adrian Prows, MD      Initial Encounter Date:  [place] PHASE II ORIENTATION from 03/02/2016 in [place]  Date  03/02/16  Referring Provider  Adrian Prows, MD      Visit Diagnosis: S/P mitral valve repair  Patient's Home Medications on Admission:  Current Outpatient Prescriptions:  .  acetaminophen (TYLENOL) 325 MG tablet, Take 650 mg by mouth every 6 (six) hours as needed for moderate pain., Disp: , Rfl:  .  atorvastatin (LIPITOR) 40 MG tablet, Take 40 mg by mouth daily., Disp: , Rfl:  .  brimonidine (ALPHAGAN) 0.2 % ophthalmic solution, Place 1 drop into both eyes 2 (two) times daily., Disp: , Rfl:  .  cyclobenzaprine (FLEXERIL) 10 MG tablet, Take 10 mg by mouth 3 (three) times daily as needed for muscle spasms., Disp: , Rfl:  .  dexlansoprazole (DEXILANT) 60 MG capsule, Take 60 mg by mouth daily., Disp: , Rfl:  .  dorzolamide-timolol (COSOPT) 22.3-6.8 MG/ML ophthalmic solution, Place 1 drop into both eyes 2 (two) times daily., Disp: , Rfl:  .  furosemide (LASIX) 40 MG tablet, Take 40 mg by mouth daily as needed for fluid or edema. , Disp: , Rfl:  .  latanoprost (XALATAN) 0.005 % ophthalmic solution, Place 1 drop into both eyes at bedtime., Disp: , Rfl:  .  metoprolol succinate (TOPROL-XL) 100 MG 24 hr tablet, Take 100 mg by mouth daily. Take with or immediately following a meal., Disp: , Rfl:  .  pilocarpine (PILOCAR) 4 % ophthalmic solution, Place 1 drop into both eyes 3 (three) times daily. , Disp: , Rfl:  .  sotalol (BETAPACE) 80 MG tablet, Take 120 mg by mouth 2 (two) times daily., Disp: , Rfl:  .  traMADol (ULTRAM) 50 MG tablet, Take 50 mg by mouth every 6 (six)  hours as needed for moderate pain or severe pain., Disp: , Rfl:  .  valsartan-hydrochlorothiazide (DIOVAN-HCT) 160-12.5 MG tablet, Take 1 tablet by mouth daily., Disp: , Rfl:  .  warfarin (COUMADIN) 5 MG tablet, Take 5 mg by mouth daily. 2.5mg  daily except 5mg  on MWF, Disp: , Rfl:  .  ZETIA 10 MG tablet, Take 10 mg by mouth daily., Disp: , Rfl:  .  zolpidem (AMBIEN) 10 MG tablet, Take 10 mg by mouth at bedtime as needed for sleep. Takes 1/2 tablet, Disp: , Rfl:   Past Medical History: Past Medical History:  Diagnosis Date  . Arrhythmia    atrial fibrillation  . Atrial fibrillation ([place])   . 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 Heart murmur   . Hypertension   . Pulmonary hypertension     Tobacco Use: History  Smoking Status  . Never Smoker  Smokeless Tobacco  . Former Systems developer  . Quit date: 03/04/1990    Labs: Recent Review Flowsheet Data    Labs for ITP Cardiac and Pulmonary Rehab Latest Ref Rng & Units 10/16/2015 10/16/2015 10/16/2015   PHART 7.350 - 7.450 - - 7.321(L)   PCO2ART 35.0 - 45.0 mmHg - - 45.5(H)   HCO3 20.0 - 24.0 mEq/L 24.2(H) 24.4(H) 23.5   TCO2 0 - 100 mmol/L 26 26 25    ACIDBASEDEF 0.0 - 2.0 mmol/L  2.0 2.0 3.0(H)   O2SAT % 64.0 64.0 91.0      Capillary Blood Glucose: No results found for: GLUCAP   Exercise Target Goals:    Exercise Program Goal: Individual exercise prescription set with THRR, safety & activity barriers. Participant demonstrates ability to understand and report RPE using BORG scale, to self-measure pulse accurately, and to acknowledge the importance of the exercise prescription.  Exercise Prescription Goal: Starting with aerobic activity 30 plus minutes a day, 3 days per week for initial exercise prescription. Provide home exercise prescription and guidelines that participant acknowledges understanding prior to discharge.  Activity Barriers & Risk Stratification:     Activity Barriers & Cardiac Risk Stratification  - 03/02/16 1413      Activity Barriers & Cardiac Risk Stratification   Activity Barriers Back Problems;History of Falls;Other (comment)   Comments Glaucoma surgery, issues with depth perception.   Cardiac Risk Stratification Moderate      6 Minute Walk:     6 Minute Walk    Row Name 03/02/16 1610         6 Minute Walk   Phase Initial     Distance 1453 feet     Walk Time 6 minutes     # of Rest Breaks 0     MPH 2.75     METS 3.66     RPE 11     VO2 Peak 12.81     Symptoms Yes (comment)     Comments Mild SOB.     Resting HR 76 bpm     Resting BP 120/60     Max Ex. HR 100 bpm     Max Ex. BP 144/78     2 Minute Post BP 120/70        Initial Exercise Prescription:     Initial Exercise Prescription - 03/03/16 0700      Date of Initial Exercise RX and Referring Provider   Date 03/02/16   Referring Provider Adrian Prows, MD     Bike   Level 0.8   Minutes 10   METs 3.63     NuStep   Level 2   Minutes 10   METs 2.8     Track   Laps 12   Minutes 10   METs 3.09     Prescription Details   Frequency (times per week) 3   Duration Progress to 30 minutes of continuous aerobic without signs/symptoms of physical distress     Intensity   THRR 40-80% of Max Heartrate 62-123   Ratings of Perceived Exertion 11-13   Perceived Dyspnea 0-4     Progression   Progression Continue to progress workloads to maintain intensity without signs/symptoms of physical distress.     Resistance Training   Training Prescription Yes   Weight 2lbs.   Reps 10-12      Perform Capillary Blood Glucose checks as needed.  Exercise Prescription Changes:     Exercise Prescription Changes    Row Name 03/30/16 1600 03/31/16 1600 04/28/16 1000 05/27/16 1500 06/22/16 1600     Exercise Review   Progression Yes Yes Yes Yes Yes     Response to Exercise   Blood Pressure (Admit) 132/60 153/84 144/76 144/72 108/60   Blood Pressure (Exercise) 130/80 144/60 150/86 150/80 118/70   Blood  Pressure (Exit) 128/62 128/70 118/68 124/64 104/58   Heart Rate (Admit) 57 bpm 58 bpm 69 bpm 61 bpm 62 bpm   Heart Rate (Exercise) 87 bpm 89 bpm 95  bpm 91 bpm 93 bpm   Heart Rate (Exit) 57 bpm 58 bpm 55 bpm 60 bpm 59 bpm   Rating of Perceived Exertion (Exercise) 13 13 13 13 13    Symptoms none none none decreased WL due to absence. There was a layoff of~ 6 weeks.  -   Comments  - reviewed HEP on 03/31/16 reviewed HEP on 03/31/16 reviewed HEP on 03/31/16 reviewed HEP on 03/31/16   Duration Progress to 30 minutes of continuous aerobic without signs/symptoms of physical distress Progress to 30 minutes of continuous aerobic without signs/symptoms of physical distress Progress to 30 minutes of continuous aerobic without signs/symptoms of physical distress Progress to 30 minutes of continuous aerobic without signs/symptoms of physical distress Progress to 30 minutes of continuous aerobic without signs/symptoms of physical distress   Intensity THRR unchanged THRR unchanged THRR unchanged THRR unchanged THRR unchanged     Progression   Average METs 2.8 2.8 3.4 2.9 4     Resistance Training   Training Prescription Yes Yes Yes Yes Yes   Weight 1lb 1lb 3lbs 3lbs 4lbs   Reps 10-12 10-12 10-12 10-12 10-12     Bike   Level 0.8 0.8 0.8 0.5  WL decreased due to layoff of >4weeks 0.9   Minutes 15 15 15 15 15    METs 3.86 3.86 3.84 3.84 3.95     NuStep   Level 2 2  - 3  -   Minutes 15 15  - 15  -   METs 1.8 1.8  - 2  -     Track   Laps  -  - 17  - 21   Minutes  -  - 15  - 15   METs  -  - 2.87  - 3.95     Home Exercise Plan   Plans to continue exercise at  - Home  reviewed on 03/31/16 see progress note Home  reviewed on 03/31/16 see progress note Home  reviewed on 03/31/16 see progress note Home  reviewed on 03/31/16 see progress note   Frequency  - Add 2 additional days to program exercise sessions. Add 2 additional days to program exercise sessions. Add 2 additional days to program exercise sessions.  Add 2 additional days to program exercise sessions.      Exercise Comments:     Exercise Comments    Row Name 03/31/16 1447 04/28/16 1630 04/28/16 1631 04/28/16 1632 05/26/16 1726   Exercise Comments Reviewed METs and goals. Pt is toleraring exercise well; will continue to monitor exercise progression Pt has been out sick for 2 weeks with a productive cough Pt has been out sick for 2 weeks with a persistent productive cough/ Pt has been out sick for ~2 weeks with a persistent productive cough/white sputum Reviewed METS and goals. Pt returned to cardiac rehab after recovering from bronchitis. Adjusted WL per patient tolerance due to not being active for ~4 weeks.   [place] Name 06/22/16 1654           Exercise Comments Reviewed METs and goals. Pt is tolerating exercise well; will continue to monitor exercise progression.          Discharge Exercise Prescription (Final Exercise Prescription Changes):     Exercise Prescription Changes - 06/22/16 1600      Exercise Review   Progression Yes     Response to Exercise   Blood Pressure (Admit) 108/60   Blood Pressure (Exercise) 118/70   Blood Pressure (Exit) 104/58   Heart  Rate (Admit) 62 bpm   Heart Rate (Exercise) 93 bpm   Heart Rate (Exit) 59 bpm   Rating of Perceived Exertion (Exercise) 13   Comments reviewed HEP on 03/31/16   Duration Progress to 30 minutes of continuous aerobic without signs/symptoms of physical distress   Intensity THRR unchanged     Progression   Average METs 4     Resistance Training   Training Prescription Yes   Weight 4lbs   Reps 10-12     Bike   Level 0.9   Minutes 15   METs 3.95     Track   Laps 21   Minutes 15   METs 3.95     Home Exercise Plan   Plans to continue exercise at Home  reviewed on 03/31/16 see progress note   Frequency Add 2 additional days to program exercise sessions.      Nutrition:  Target Goals: Understanding of nutrition guidelines, daily intake of sodium 1500mg ,  cholesterol 200mg , calories 30% from fat and 7% or less from saturated fats, daily to have 5 or more servings of fruits and vegetables.  Biometrics:     Pre Biometrics - 03/02/16 1611      Pre Biometrics   Height 5\' 3"  (1.6 m)   Weight 127 lb 3.3 oz (57.7 kg)   Waist Circumference 27.75 inches   Hip Circumference 35.75 inches   Waist to Hip Ratio 0.78 %   BMI (Calculated) 22.6   Triceps Skinfold 17 mm   % Body Fat 31.1 %   Grip Strength 23.5 kg   Flexibility 0 in   Single Leg Stand 6.75 seconds       Nutrition Therapy Plan and Nutrition Goals:     Nutrition Therapy & Goals - 03/05/16 1045      Nutrition Therapy   Diet General, healthful     Personal Nutrition Goals   Personal Goal #1 Wt maintenance while in [place], educate and counsel regarding individualized specific dietary modifications aiming towards targeted core components such as weight, hypertension, lipid management, diabetes, heart failure and other comorbidities.   Expected Outcomes Short Term Goal: Understand basic principles of dietary content, such as calories, fat, sodium, cholesterol and nutrients.;Long Term Goal: Adherence to prescribed nutrition plan.      Nutrition Discharge: Nutrition Scores:     Nutrition Assessments - 03/05/16 1047      MEDFICTS Scores   Pre Score 31      Nutrition Goals Re-Evaluation:   Psychosocial: Target Goals: Acknowledge presence or absence of depression, maximize coping skills, provide positive support system. Participant is able to verbalize types and ability to use techniques and skills needed for reducing stress and depression.  Initial Review & Psychosocial Screening:     Initial Psych Review & Screening - 03/04/16 [place]? Yes     Barriers   Psychosocial barriers to participate in program There are no identifiable barriers or psychosocial needs.      Screening Interventions   Interventions Encouraged to exercise      Quality of Life Scores:     Quality of Life - 03/02/16 1354      Quality of Life Scores   Health/Function Pre 29.2 %   Socioeconomic Pre 30 %   Psych/Spiritual Pre 24.86 %   Family Pre 28.8 %   GLOBAL Pre 28.41 %  PHQ-9: Recent Review Flowsheet Data    Depression screen [facility] 2/9 03/08/2016   Decreased Interest 0   Down, Depressed, Hopeless 0   PHQ - 2 Score 0      Psychosocial Evaluation and Intervention:     Psychosocial Evaluation - 03/30/16 1001      Psychosocial Evaluation & Interventions   Interventions Stress management education;Relaxation education   Comments no psychosocial needs identified, no interventions necessary.    Continued Psychosocial Services Needed No      Psychosocial Re-Evaluation:     Psychosocial Re-Evaluation    Row Name 03/30/16 1001 04/27/16 1217 05/28/16 0728 06/21/16 0734       Psychosocial Re-Evaluation   Interventions Encouraged to attend Cardiac Rehabilitation for the exercise;Stress management education;Relaxation education Encouraged to attend Cardiac Rehabilitation for the exercise;Stress management education;Relaxation education Encouraged to attend Cardiac Rehabilitation for the exercise;Stress management education;Relaxation education Encouraged to attend Cardiac Rehabilitation for the exercise;Stress management education;Relaxation education    Comments no psychosocial needs identified, no interventions necessary.  pt with frequent absences due to medication adjustments. pt symptoms are relieved and pt is looking forward to resuming her CR participation.  no psychosocial needs identified, no interventions necessary.  pt with frequent absences due to bronchitis being treated by her PCP.   pt is looking forward to resuming her CR participation once her symptoms have resolved.   no psychosocial needs identified, no interventions necessary.  pt returned after  extended absence for bronchitis.  pt is deconditioned as a result.  no psychosocial needs identified, no interventions necessary. pt has demonstrated significant progress since returning after extended illlness. pt is pleased with her increased strength and stamina.      Continued Psychosocial Services Needed No No No No       Vocational Rehabilitation: Provide vocational rehab assistance to qualifying candidates.   Vocational Rehab Evaluation & Intervention:     Vocational Rehab - 03/04/16 1143      Initial Vocational Rehab Evaluation & Intervention   Assessment shows need for Vocational Rehabilitation No  Mrs Coaker is retired      Education: Education Goals: Education classes will be provided on a weekly basis, covering required topics. Participant will state understanding/return demonstration of topics presented.  Learning Barriers/Preferences:     Learning Barriers/Preferences - 03/03/16 0719      Learning Barriers/Preferences   Learning Barriers Hearing   Learning Preferences Skilled Demonstration      Education Topics: Count Your Pulse:  -Group instruction provided by verbal instruction, demonstration, patient participation and written materials to support subject.  Instructors address importance of being able to find your pulse and how to count your pulse when at home without a heart monitor.  Patients get hands on experience counting their pulse with staff help and individually. Flowsheet Row CARDIAC REHAB PHASE II EXERCISE from 06/18/2016 in [place]  Date  05/28/16  Educator  Cherre Huger  Instruction Review Code  2- meets goals/outcomes      Heart Attack, Angina, and Risk Factor Modification:  -Group instruction provided by verbal instruction, video, and written materials to support subject.  Instructors address signs and symptoms of angina and heart attacks.    Also discuss risk factors for heart disease and how to make  changes to improve heart health risk factors. Flowsheet Row CARDIAC REHAB PHASE II EXERCISE from 06/18/2016 in [place]  Date  06/16/16  Instruction Review Code  2- meets goals/outcomes  Functional Fitness:  -Group instruction provided by verbal instruction, demonstration, patient participation, and written materials to support subject.  Instructors address safety measures for doing things around the house.  Discuss how to get up and down off the floor, how to pick things up properly, how to safely get out of a chair without assistance, and balance training. Flowsheet Row CARDIAC REHAB PHASE II EXERCISE from 06/18/2016 in [place]  Date  04/02/16  Instruction Review Code  2- meets goals/outcomes      Meditation and Mindfulness:  -Group instruction provided by verbal instruction, patient participation, and written materials to support subject.  Instructor addresses importance of mindfulness and meditation practice to help reduce stress and improve awareness.  Instructor also leads participants through a meditation exercise.  Flowsheet Row CARDIAC REHAB PHASE II EXERCISE from 06/18/2016 in [place]  Date  03/17/16  Educator  [place]  Instruction Review Code  2- meets goals/outcomes      Stretching for Flexibility and Mobility:  -Group instruction provided by verbal instruction, patient participation, and written materials to support subject.  Instructors lead participants through series of stretches that are designed to increase flexibility thus improving mobility.  These stretches are additional exercise for major muscle groups that are typically performed during regular warm up and cool down. Flowsheet Row CARDIAC REHAB PHASE II EXERCISE from 06/18/2016 in [facility]  Date  06/18/16  Educator  [place]  Instruction Review Code  2- meets  goals/outcomes      Hands Only CPR Anytime:  -Group instruction provided by verbal instruction, video, patient participation and written materials to support subject.  Instructors co-teach with [facility]HA video for hands only CPR.  Participants get hands on experience with mannequins.   Nutrition I class: Heart Healthy Eating:  -Group instruction provided by PowerPoint slides, verbal discussion, and written materials to support subject matter. The instructor gives an explanation and review of the Therapeutic Lifestyle Changes diet recommendations, which includes a discussion on lipid goals, dietary fat, sodium, fiber, plant stanol/sterol esters, sugar, and the components of a well-balanced, healthy diet.   Nutrition II class: Lifestyle Skills:  -Group instruction provided by PowerPoint slides, verbal discussion, and written materials to support subject matter. The instructor gives an explanation and review of label reading, grocery shopping for heart health, heart healthy recipe modifications, and ways to make healthier choices when eating out.   Diabetes Question & Answer:  -Group instruction provided by PowerPoint slides, verbal discussion, and written materials to support subject matter. The instructor gives an explanation and review of diabetes co-morbidities, pre- and post-prandial blood glucose goals, pre-exercise blood glucose goals, signs, symptoms, and treatment of hypoglycemia and hyperglycemia, and foot care basics.   Diabetes Blitz:  -Group instruction provided by PowerPoint slides, verbal discussion, and written materials to support subject matter. The instructor gives an explanation and review of the physiology behind type 1 and type 2 diabetes, diabetes medications and rational behind using different medications, pre- and post-prandial blood glucose recommendations and Hemoglobin A1c goals, diabetes diet, and exercise including blood glucose guidelines for exercising safely.     Portion Distortion:  -Group instruction provided by PowerPoint slides, verbal discussion, written materials, and food models to support subject matter. The instructor gives an explanation of serving size versus portion size, changes in portions sizes over the last 20 years, and what consists of a serving from each food group.   Stress Management:  -  Group instruction provided by verbal instruction, video, and written materials to support subject matter.  Instructors review role of stress in heart disease and how to cope with stress positively.   Flowsheet Row CARDIAC REHAB PHASE II EXERCISE from 06/18/2016 in [place]  Date  04/07/16  Instruction Review Code  2- meets goals/outcomes      Exercising on Your Own:  -Group instruction provided by verbal instruction, power point, and written materials to support subject.  Instructors discuss benefits of exercise, components of exercise, frequency and intensity of exercise, and end points for exercise.  Also discuss use of nitroglycerin and activating EMS.  Review options of places to exercise outside of rehab.  Review guidelines for sex with heart disease.   Cardiac Drugs I:  -Group instruction provided by verbal instruction and written materials to support subject.  Instructor reviews cardiac drug classes: antiplatelets, anticoagulants, beta blockers, and statins.  Instructor discusses reasons, side effects, and lifestyle considerations for each drug class.   Cardiac Drugs II:  -Group instruction provided by verbal instruction and written materials to support subject.  Instructor reviews cardiac drug classes: angiotensin converting enzyme inhibitors (ACE-I), angiotensin II receptor blockers (ARBs), nitrates, and calcium channel blockers.  Instructor discusses reasons, side effects, and lifestyle considerations for each drug class. Flowsheet Row CARDIAC REHAB PHASE II EXERCISE from 06/18/2016 in [place]  Date  03/31/16  Educator  pharmacist  Instruction Review Code  2- meets goals/outcomes      Anatomy and Physiology of the Circulatory System:  -Group instruction provided by verbal instruction, video, and written materials to support subject.  Reviews functional anatomy of heart, how it relates to various diagnoses, and what role the heart plays in the overall system.   Knowledge Questionnaire Score:     Knowledge Questionnaire Score - 03/02/16 1354      Knowledge Questionnaire Score   Pre Score 18/24      Core Components/Risk Factors/Patient Goals at Admission:     Personal Goals and Risk Factors at Admission - 03/02/16 1156      Core Components/Risk Factors/Patient Goals on Admission   Hypertension Yes   Intervention Provide education on lifestyle modifcations including regular physical activity/exercise, weight management, moderate sodium restriction and increased consumption of fresh fruit, vegetables, and low fat dairy, alcohol moderation, and smoking cessation.;Monitor prescription use compliance.   Expected Outcomes Short Term: Continued assessment and intervention until BP is < 140/95mm HG in hypertensive participants. < 130/10mm HG in hypertensive participants with diabetes, heart failure or chronic kidney disease.;Long Term: Maintenance of blood pressure at goal levels.   Lipids Yes   Intervention Provide education and support for participant on nutrition & aerobic/resistive exercise along with prescribed medications to achieve LDL 70mg , HDL >40mg .   Expected Outcomes Short Term: Participant states understanding of desired cholesterol values and is compliant with medications prescribed. Participant is following exercise prescription and nutrition guidelines.;Long Term: Cholesterol controlled with medications as prescribed, with individualized exercise RX and with personalized nutrition plan. Value goals: LDL < 70mg , HDL > 40 mg.   Stress  Yes   Intervention Offer individual and/or small group education and counseling on adjustment to heart disease, stress management and health-related lifestyle change. Teach and support self-help strategies.;Refer participants experiencing significant psychosocial distress to appropriate mental health specialists for further evaluation and treatment. When possible, include family members and significant others in education/counseling sessions.   Expected Outcomes Short Term: Participant demonstrates changes in health-related  behavior, relaxation and other stress management skills, ability to obtain effective social support, and compliance with psychotropic medications if prescribed.;Long Term: Emotional wellbeing is indicated by absence of clinically significant psychosocial distress or social isolation.   Personal Goal Other Yes   Personal Goal Improve posture. Increase muscle tone. [facility] capabilities [facility]   Intervention Provide individulaized home exercise program with parameters for exercise, including stretching, aerobic exercise and resistance training to improve muscle tone and improve fitness.   [facility] Improved muscle tone measured by tricep skinfold measurement and grip strength.      Core Components/Risk Factors/Patient Goals Review:      Goals and Risk Factor Review    Row Name 03/31/16 1448 05/26/16 1728 06/22/16 1655         Core Components/Risk Factors/Patient Goals Review   Personal Goals Review Increase Strength and Stamina;Other Other  -     Review Reviewed HEP with pt in which pt will walk at home to improve strength and stamina Reduced WL due to being inactive for ~ 4 weeks. Pt stated having better postural awareness. Discussed adding weights to exercise regimen to help improve postural muscles.. Pt has responded postively to exercise changes and WL increases. Pt stamina and strength has increased. Pt  posture has also increased. Pt joined a new fitness facility.     Expected Outcomes Pt will improve cardiovascular fitness and functional capacity Pt will get back into exercise routine and improve in strength. Pt will continue to respond well to WL increases and exercise changes to build on cardiorespiratory fitness. Pt will also continue to build on postural muscles to maintain upright posture.         Core Components/Risk Factors/Patient Goals at Discharge (Final Review):      Goals and Risk Factor Review - 06/22/16 1655      Core Components/Risk Factors/Patient Goals Review   Review Pt has responded postively to exercise changes and WL increases. Pt stamina and strength has increased. Pt posture has also increased. Pt joined a new fitness facility.   Expected Outcomes Pt will continue to respond well to WL increases and exercise changes to build on cardiorespiratory fitness. Pt will also continue to build on postural muscles to maintain upright posture.       ITP Comments:     ITP Comments    Row Name 03/02/16 1148           ITP Comments Dr. Fransico Him, Medical Director           Comments: Despite long medical absence, Pt is now  making expected progress toward personal goals after completing 14 sessions. Recommend continued exercise and life style modification education including  stress management and relaxation techniques to decrease cardiac risk profile.

## 2016-06-25 ENCOUNTER — Encounter (HOSPITAL_COMMUNITY)
Admission: RE | Admit: 2016-06-25 | Discharge: 2016-06-25 | Disposition: A | Discharge status: Home / Self Care | Payer: Medicare Other | Source: Ambulatory Visit | Attending: Cardiology | Admitting: Cardiology

## 2016-06-25 DIAGNOSIS — Z7901 Long term (current) use of anticoagulants: Secondary | ICD-10-CM | POA: Diagnosis not present

## 2016-06-25 DIAGNOSIS — Z9889 Other specified postprocedural states: Secondary | ICD-10-CM | POA: Diagnosis not present

## 2016-06-28 ENCOUNTER — Encounter (HOSPITAL_COMMUNITY)
Admission: RE | Admit: 2016-06-28 | Discharge: 2016-06-28 | Disposition: A | Discharge status: Home / Self Care | Payer: Medicare Other | Source: Ambulatory Visit | Attending: Cardiology | Admitting: Cardiology

## 2016-06-28 DIAGNOSIS — Z9889 Other specified postprocedural states: Secondary | ICD-10-CM | POA: Diagnosis not present

## 2016-06-28 NOTE — Progress Notes (Signed)
Laura Knapp 66 y.o. female Nutrition Note Spoke with pt. Nutrition Survey reviewed with pt. Pt is following Step 1 of the Therapeutic Lifestyle Changes diet. Per discussion, pt lost wt with her surgery and wants to maintain her current wt. Pt has lost 8.4 lb (3.8 kg) since starting rehab. Pt states she drinks Ensure High Protein 2-3 times/week to help increase her p.o. Intake to prevent further wt loss. Pt is on Coumadin and is aware of the need to keep vitamin K intake consistent. Pt expressed understanding of the information reviewed. Pt aware of nutrition education classes offered and is unable to attend nutrition classes. No results found for: HGBA1C Wt Readings from Last 3 Encounters:  03/02/16 127 lb 3.3 oz (57.7 kg)  10/16/15 130 lb (59 kg)  09/09/15 139 lb (63 kg)   Nutrition Diagnosis ? Food-and nutrition-related knowledge deficit related to lack of exposure to information as related to diagnosis of: ? CVD Nutrition Intervention ? Benefits of adopting Therapeutic Lifestyle Changes discussed when [employer] reviewed. ? Pt to attend the Portion Distortion class ? Pt given handouts for: ? Nutrition I class ? Nutrition II class ? Continue client-centered nutrition education by RD, as part of interdisciplinary care.  Goal(s) ? Pt to identify food quantities necessary to achieve weight maintenance of 118-120 lb at graduation from cardiac rehab.   Monitor and Evaluate progress toward nutrition goal with team.  Derek Mound, M.Ed, RD, LDN, CDE 06/28/2016 1:56 PM

## 2016-06-30 ENCOUNTER — Encounter (HOSPITAL_COMMUNITY)
Admission: RE | Admit: 2016-06-30 | Discharge: 2016-06-30 | Disposition: A | Discharge status: Home / Self Care | Payer: Medicare Other | Source: Ambulatory Visit | Attending: Cardiology | Admitting: Cardiology

## 2016-06-30 DIAGNOSIS — Z9889 Other specified postprocedural states: Secondary | ICD-10-CM | POA: Diagnosis not present

## 2016-07-02 ENCOUNTER — Encounter (HOSPITAL_COMMUNITY)
Admission: RE | Admit: 2016-07-02 | Discharge: 2016-07-02 | Disposition: A | Discharge status: Home / Self Care | Payer: Medicare Other | Source: Ambulatory Visit | Attending: Cardiology | Admitting: Cardiology

## 2016-07-02 DIAGNOSIS — Z9889 Other specified postprocedural states: Secondary | ICD-10-CM | POA: Diagnosis not present

## 2016-07-05 ENCOUNTER — Encounter (HOSPITAL_COMMUNITY): Payer: Self-pay

## 2016-07-05 ENCOUNTER — Encounter (HOSPITAL_COMMUNITY)
Admission: RE | Admit: 2016-07-05 | Discharge: 2016-07-05 | Disposition: A | Discharge status: Home / Self Care | Payer: Medicare Other | Source: Ambulatory Visit | Attending: Cardiology | Admitting: Cardiology

## 2016-07-05 VITALS — Ht 63.0 in | Wt 118.8 lb

## 2016-07-05 DIAGNOSIS — Z9889 Other specified postprocedural states: Secondary | ICD-10-CM | POA: Diagnosis not present

## 2016-07-05 NOTE — Progress Notes (Signed)
Cardiac Individual Treatment Plan  Patient Details  Name: Laura Knapp DOBLE MRN: [id number] Date of Birth: [birth year] Referring Provider:   Flowsheet Row CARDIAC REHAB PHASE II ORIENTATION from 03/02/2016 in [place]  Referring Provider  Adrian Prows, MD      Initial Encounter Date:  [place] PHASE II ORIENTATION from 03/02/2016 in [place]  Date  03/02/16  Referring Provider  Adrian Prows, MD      Visit Diagnosis: S/P mitral valve repair  Patient's Home Medications on Admission:  Current Outpatient Prescriptions:  .  acetaminophen (TYLENOL) 325 MG tablet, Take 650 mg by mouth every 6 (six) hours as needed for moderate pain., Disp: , Rfl:  .  atorvastatin (LIPITOR) 40 MG tablet, Take 40 mg by mouth daily., Disp: , Rfl:  .  brimonidine (ALPHAGAN) 0.2 % ophthalmic solution, Place 1 drop into both eyes 2 (two) times daily., Disp: , Rfl:  .  cyclobenzaprine (FLEXERIL) 10 MG tablet, Take 10 mg by mouth 3 (three) times daily as needed for muscle spasms., Disp: , Rfl:  .  dexlansoprazole (DEXILANT) 60 MG capsule, Take 60 mg by mouth daily., Disp: , Rfl:  .  dorzolamide-timolol (COSOPT) 22.3-6.8 MG/ML ophthalmic solution, Place 1 drop into both eyes 2 (two) times daily., Disp: , Rfl:  .  latanoprost (XALATAN) 0.005 % ophthalmic solution, Place 1 drop into both eyes at bedtime., Disp: , Rfl:  .  metoprolol succinate (TOPROL-XL) 100 MG 24 hr tablet, Take 100 mg by mouth daily. Take with or immediately following a meal., Disp: , Rfl:  .  pilocarpine (PILOCAR) 4 % ophthalmic solution, Place 1 drop into both eyes 3 (three) times daily. , Disp: , Rfl:  .  sotalol (BETAPACE) 80 MG tablet, Take 120 mg by mouth 2 (two) times daily., Disp: , Rfl:  .  traMADol (ULTRAM) 50 MG tablet, Take 50 mg by mouth every 6 (six) hours as needed for moderate pain or severe pain., Disp: , Rfl:  .  valsartan-hydrochlorothiazide  (DIOVAN-HCT) 160-12.5 MG tablet, Take 1 tablet by mouth daily., Disp: , Rfl:  .  warfarin (COUMADIN) 5 MG tablet, Take 5 mg by mouth daily. 2.40m daily except 566mon MWF, Disp: , Rfl:  .  ZETIA 10 MG tablet, Take 10 mg by mouth daily., Disp: , Rfl:  .  furosemide (LASIX) 40 MG tablet, Take 40 mg by mouth daily as needed for fluid or edema. , Disp: , Rfl:  .  zolpidem (AMBIEN) 10 MG tablet, Take 10 mg by mouth at bedtime as needed for sleep. Takes 1/2 tablet, Disp: , Rfl:   Past Medical History: Past Medical History:  Diagnosis Date  . Arrhythmia    atrial fibrillation  . [place]rial fibrillation ([place]  . 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 Heart murmur   . Hypertension   . Pulmonary hypertension     Tobacco Use: History  Smoking Status  . Never Smoker  Smokeless Tobacco  . 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 Participant demonstrates ability to understand and report RPE using BORG scale, to self-measure pulse accurately, and to acknowledge the importance of the exercise prescription.  Exercise Prescription Goal: Starting with aerobic activity 30 plus minutes a day, 3 days per week for initial exercise prescription. Provide home exercise prescription and guidelines that participant acknowledges understanding prior to discharge.  Activity Barriers & Risk Stratification:     Activity Barriers & Cardiac Risk  Stratification - 03/02/16 1413      Activity Barriers & Cardiac Risk Stratification   Activity Barriers Back Problems;History of Falls;Other (comment)   Comments Glaucoma surgery, issues with depth perception.   Cardiac Risk Stratification Moderate      6 Minute Walk:     6 Minute Walk    Row Name 03/02/16 1610 07/14/16 1723       6 Minute Walk   Phase Initial Discharge    Distance 1453 feet 1500 feet    Distance % Change  - 3.23 %    Walk Time 6 minutes 6 minutes    # of Rest Breaks 0 0    MPH 2.75 2.84    METS 3.66 3.78    RPE 11 10    VO2 Peak 12.81 13.2    Symptoms Yes (comment) No    Comments Mild SOB.  -    Resting HR 76 bpm 52 bpm    Resting BP 120/60 120/74    Max Ex. HR 100 bpm 104 bpm    Max Ex. BP 144/78 134/82    2 Minute Post BP 120/70  -       Initial Exercise Prescription:     Initial Exercise Prescription - 03/03/16 0700      Date of Initial Exercise RX and Referring Provider   Date 03/02/16   Referring Provider Adrian Prows, MD     Bike   Level 0.8   Minutes 10   METs 3.63     NuStep   Level 2   Minutes 10   METs 2.8     Track   Laps 12   Minutes 10   METs 3.09     Prescription Details   Frequency (times per week) 3   Duration Progress to 30 minutes of continuous aerobic without signs/symptoms of physical distress     Intensity   THRR 40-80% of Max Heartrate 62-123   Ratings of Perceived Exertion 11-13   Perceived Dyspnea 0-4     Progression   Progression Continue to progress workloads to maintain intensity without signs/symptoms of physical distress.     Resistance Training   Training Prescription Yes   Weight 2lbs.   Reps 10-12      Perform Capillary Blood Glucose checks as needed.  Exercise Prescription Changes:      Exercise Prescription Changes    Row Name 03/30/16 1600 03/31/16 1600 04/28/16 1000 05/27/16 1500 06/22/16 1600     Response to Exercise   Blood Pressure (Admit) 132/60 153/84 144/76 144/72 108/60    Blood Pressure (Exercise) 130/80 144/60 150/86 150/80 118/70   Blood Pressure (Exit) 128/62 128/70 118/68 124/64 104/58   Heart Rate (Admit) 57 bpm 58 bpm 69 bpm 61 bpm 62 bpm   Heart Rate (Exercise) 87 bpm  89 bpm 95 bpm 91 bpm 93 bpm   Heart Rate (Exit) 57 bpm 58 bpm 55 bpm 60 bpm 59 bpm   Rating of Perceived Exertion (Exercise) _0 Symptoms none none none decreased WL due to absence. There was a layoff of~ 6 weeks.  -   Comments  - reviewed HEP on 03/31/16 reviewed HEP on 03/31/16 reviewed HEP on 03/31/16 reviewed HEP on 03/31/16   Duration Progress to 30 minutes of continuous aerobic without signs/symptoms of physical distress Progress to 30 minutes of continuous aerobic without signs/symptoms of physical distress Progress to 30 minutes of continuous aerobic without signs/symptoms of physical distress Progress to 30 minutes of continuous aerobic without signs/symptoms of physical distress Progress to 30 minutes of continuous aerobic without signs/symptoms of physical distress   Intensity _1      Progression   Average METs 2.8 2.8 3.4 2.9 4     Resistance Training   Training Prescription _2    Weight 1lb 1lb 3lbs 3lbs 4lbs   Reps 10-12 10-12 10-12 10-12 10-12     Bike   Level 0.8 0.8 0.8 0.5  WL decreased due to layoff of >4weeks 0.9   Minutes _3 METs 3.86 3.86 3.84 3.84 3.95     NuStep   Level 2 2  - 3  -   Minutes 15 15  - 15  -   METs 1.8 1.8  - 2  -     Track   Laps  -  - 17  - 21   Minutes  -  - 15  - 15   METs  -  - 2.87  - 3.95     Home Exercise Plan   Plans to continue exercise at  - Home  reviewed on 03/31/16 see progress note Home  reviewed on 03/31/16 see progress note Home  reviewed on 03/31/16 see progress note Home  reviewed on 03/31/16 see progress note   Frequency  - Add 2 additional days to program exercise sessions. Add 2 additional days to program  exercise sessions. Add 2 additional days to program exercise sessions. Add 2 additional days to program exercise sessions.     Exercise Review   Progression _4    Row Name 07/15/16 0700             Response to Exercise   Blood Pressure (Admit) 126/73       Blood Pressure (Exercise) 128/64       Blood Pressure (Exit) 112/70       Heart Rate (Admit) 49 bpm       Heart Rate (Exercise) 82 bpm       Heart Rate (Exit) 56 bpm       Rating of Perceived Exertion (Exercise) 9       Comments reviewed HEP on 03/31/16       Duration Progress to 30 minutes of continuous aerobic without signs/symptoms of physical distress       Intensity THRR unchanged         Progression   Average METs (P)  2.8         Resistance Training   Training Prescription Yes       Weight 4lbs       Reps 10-12         NuStep   Level 4  Minutes 15       METs 2         Track   Laps 22       Minutes 15       METs (P)  3.68         Home Exercise Plan   Plans to continue exercise at [facility] on 03/31/16       Frequency Add 2 additional days to program exercise sessions.         Exercise Review   Progression Yes          Exercise Comments:      Exercise Comments    Row Name 03/31/16 1447 04/28/16 1630 04/28/16 1631 04/28/16 1632 05/26/16 1726   Exercise Comments Reviewed METs and goals. Pt is toleraring exercise well; will continue to monitor exercise progression Pt has been out sick for 2 weeks with a productive cough Pt has been out sick for 2 weeks with a persistent productive cough/ Pt has been out sick for ~2 weeks with a persistent productive cough/white sputum Reviewed METS and goals. Pt returned to cardiac rehab after recovering from bronchitis. Adjusted WL per patient tolerance due to not being active for ~4 weeks.   [place] Name 06/22/16 1654 07/16/16 0829         Exercise Comments Reviewed METs and goals. Pt is tolerating exercise well; will continue to monitor  exercise progression. Pt completed 22 sessions of cardiac rehab. Pt plans to [facility] 3x/week and walk 2x/week. Discussed temperature/emergency precautions. Pt verbalize understanding.         Discharge Exercise Prescription (Final Exercise Prescription Changes):     Exercise Prescription Changes - 07/15/16 0700      Response to Exercise   Blood Pressure (Admit) 126/73   Blood Pressure (Exercise) 128/64   Blood Pressure (Exit) 112/70   Heart Rate (Admit) 49 bpm   Heart Rate (Exercise) 82 bpm   Heart Rate (Exit) 56 bpm   Rating of Perceived Exertion (Exercise) 9   Comments reviewed HEP on 03/31/16   Duration Progress to 30 minutes of continuous aerobic without signs/symptoms of physical distress   Intensity THRR unchanged     Progression   Average METs (P)  2.8     Resistance Training   Training Prescription Yes   Weight 4lbs   Reps 10-12     NuStep   Level 4   Minutes 15   METs 2     Track   Laps 22   Minutes 15   METs (P)  3.68     Home Exercise Plan   Plans to continue exercise at [facility] on 03/31/16   Frequency Add 2 additional days to program exercise sessions.     Exercise Review   Progression Yes      Nutrition:  Target Goals: Understanding of nutrition guidelines, daily intake of sodium <1528m, cholesterol <2039m calories 30% from fat and 7% or less from saturated fats, daily to have 5 or more servings of fruits and vegetables.  Biometrics:     Pre Biometrics - 03/02/16 1611      Pre Biometrics   Height 5' 3" (1.6 m)   Weight 127 lb 3.3 oz (57.7 kg)   Waist Circumference 27.75 inches   Hip Circumference 35.75 inches   Waist to Hip Ratio 0.78 %   BMI (Calculated) 22.6   Triceps Skinfold 17 mm   % Body Fat 31.1 %  Grip Strength 23.5 kg   Flexibility 0 in   Single Leg Stand 6.75 seconds         Post Biometrics - 07/14/16 1724       Post  Biometrics   Height 5' 3" (1.6 m)   Weight 118 lb 13.3 oz (53.9 kg)    Waist Circumference 28.25 inches   Hip Circumference 36.25 inches   Waist to Hip Ratio 0.78 %   BMI (Calculated) 21.1   Triceps Skinfold 16 mm   % Body Fat 30.2 %   Grip Strength 23.5 kg   Flexibility 10 in   Single Leg Stand 5.09 seconds      Nutrition Therapy Plan and Nutrition Goals:     Nutrition Therapy & Goals - 03/05/16 1045      Nutrition Therapy   Diet General, healthful     Personal Nutrition Goals   Nutrition Goal Wt maintenance while in Cardiac Rehab     Intervention Plan   Intervention Prescribe, educate and counsel regarding individualized specific dietary modifications aiming towards targeted core components such as weight, hypertension, lipid management, diabetes, heart failure and other comorbidities.   Expected Outcomes Short Term Goal: Understand basic principles of dietary content, such as calories, fat, sodium, cholesterol and nutrients.;Long Term Goal: Adherence to prescribed nutrition plan.      Nutrition Discharge: Nutrition Scores:     Nutrition Assessments - 07/21/16 1025      MEDFICTS Scores   Pre Score 53   Post Score 16   Score Difference -37      Nutrition Goals Re-Evaluation:     Nutrition Goals Re-Evaluation    Row Name 06/28/16 1400 07/21/16 1026           Goals   Current Weight 118 lb 13.3 oz (53.9 kg) 118 lb 9.3 oz (53.8 kg)      Nutrition Goal Maintain wt around 118-120 lb while in Cardiac Rehab Maintain wt around 118-120 lb while in Cardiac Rehab      Comment Pt has lost 8.4 lb while in Cardiac Rehab. Pt wt 118.58 lb at discharge. Wt maintenance goal met.       Expected Outcome  - Continue to maintain wt around 118-120 lb        Personal Goal #1 Re-Evaluation   Goal Progress Seen No Yes        Intervention Plan   Intervention Continue to educate, counsel and set short/long term goals regarding individualized specific personal dietary modifications.  -         Psychosocial: Target Goals: Acknowledge presence or  absence of depression, maximize coping skills, provide positive support system. Participant is able to verbalize types and ability to use techniques and skills needed for reducing stress and depression.  Initial Review & Psychosocial Screening:     Initial Psych Review & Screening - 03/04/16 [place]? Yes     Barriers   Psychosocial barriers to participate in program There are no identifiable barriers or psychosocial needs.     Screening Interventions   Interventions Encouraged to exercise      Quality of Life Scores:     Quality of Life - 07/02/16 1545      Quality of Life Scores   Health/Function Pre 29.2 %   Health/Function Post 30 %   Health/Function % Change 2.74 %   Socioeconomic Pre 30 %   Socioeconomic Post 30 %   Socioeconomic %  Change  0 %   Psych/Spiritual Pre 24.86 %   Psych/Spiritual Post 30 %   Psych/Spiritual % Change 20.68 %   Family Pre 28.8 %   Family Post 28.8 %   Family % Change 0 %   GLOBAL Pre 28.41 %   GLOBAL Post 29.82 %   GLOBAL % Change 4.96 %      PHQ-9: Recent Review Flowsheet Data    Depression screen [facility] 2/9 07/05/2016 03/08/2016   Decreased Interest 0 0   Down, Depressed, Hopeless 0 0   PHQ - 2 Score 0 0      Psychosocial Evaluation and Intervention:     Psychosocial Evaluation - 07/05/16 1614      Psychosocial Evaluation & Interventions   Comments no psychosocial needs identified, no interventions necessary.    Continue Psychosocial Services  No     Discharge Psychosocial Assessment & Intervention   Discharge Continue support measures as needed      Psychosocial Re-Evaluation:     Psychosocial Re-Evaluation    Row Name 03/30/16 1001 04/27/16 1217 05/28/16 0728 06/21/16 0734       Psychosocial Re-Evaluation   Comments no psychosocial needs identified, no interventions necessary.  pt with frequent absences due to medication adjustments. pt symptoms are relieved and pt is  looking forward to resuming her CR participation.  no psychosocial needs identified, no interventions necessary.  pt with frequent absences due to bronchitis being treated by her PCP.   pt is looking forward to resuming her CR participation once her symptoms have resolved.   no psychosocial needs identified, no interventions necessary.  pt returned after extended absence for bronchitis.  pt is deconditioned as a result.  no psychosocial needs identified, no interventions necessary. pt has demonstrated significant progress since returning after extended illlness. pt is pleased with her increased strength and stamina.      Interventions Encouraged to attend Cardiac Rehabilitation for the exercise;Stress management education;Relaxation education Encouraged to attend Cardiac Rehabilitation for the exercise;Stress management education;Relaxation education Encouraged to attend Cardiac Rehabilitation for the exercise;Stress management education;Relaxation education Encouraged to attend Cardiac Rehabilitation for the exercise;Stress management education;Relaxation education    Continue Psychosocial Services  No No No No       Vocational Rehabilitation: Provide vocational rehab assistance to qualifying candidates.   Vocational Rehab Evaluation & Intervention:     Vocational Rehab - 03/04/16 1143      Initial Vocational Rehab Evaluation & Intervention   Assessment shows need for Vocational Rehabilitation No  Mrs Amini is retired      Education: Education Goals: Education classes will be provided on a weekly basis, covering required topics. Participant will state understanding/return demonstration of topics presented.  Learning Barriers/Preferences:     Learning Barriers/Preferences - 03/03/16 0719      Learning Barriers/Preferences   Learning Barriers Hearing   Learning Preferences Skilled Demonstration      Education Topics: Count Your Pulse:  -Group instruction provided by verbal  instruction, demonstration, patient participation and written materials to support subject.  Instructors address importance of being able to find your pulse and how to count your pulse when at home without a heart monitor.  Patients get hands on experience counting their pulse with staff help and individually. Flowsheet Row CARDIAC REHAB PHASE II EXERCISE from 06/28/2016 in [place]  Date  05/28/16  Educator  Cherre Huger  Instruction Review Code  2- meets goals/outcomes      Heart Attack, Angina,  and Risk Factor Modification:  -Group instruction provided by verbal instruction, video, and written materials to support subject.  Instructors address signs and symptoms of angina and heart attacks.    Also discuss risk factors for heart disease and how to make changes to improve heart health risk factors. Flowsheet Row CARDIAC REHAB PHASE II EXERCISE from 06/28/2016 in [place]  Date  06/16/16  Instruction Review Code  2- meets goals/outcomes      Functional Fitness:  -Group instruction provided by verbal instruction, demonstration, patient participation, and written materials to support subject.  Instructors address safety measures for doing things around the house.  Discuss how to get up and down off the floor, how to pick things up properly, how to safely get out of a chair without assistance, and balance training. Flowsheet Row CARDIAC REHAB PHASE II EXERCISE from 06/28/2016 in [place]  Date  04/02/16  Instruction Review Code  2- meets goals/outcomes      Meditation and Mindfulness:  -Group instruction provided by verbal instruction, patient participation, and written materials to support subject.  Instructor addresses importance of mindfulness and meditation practice to help reduce stress and improve awareness.  Instructor also leads participants through a meditation exercise.  Flowsheet  Row CARDIAC REHAB PHASE II EXERCISE from 06/28/2016 in [place]  Date  03/17/16  Educator  [place]  Instruction Review Code  2- meets goals/outcomes      Stretching for Flexibility and Mobility:  -Group instruction provided by verbal instruction, patient participation, and written materials to support subject.  Instructors lead participants through series of stretches that are designed to increase flexibility thus improving mobility.  These stretches are additional exercise for major muscle groups that are typically performed during regular warm up and cool down. Flowsheet Row CARDIAC REHAB PHASE II EXERCISE from 06/28/2016 in [place]  Date  06/18/16  Educator  [place]  Instruction Review Code  2- meets goals/outcomes      Hands Only CPR Anytime:  -Group instruction provided by verbal instruction, video, patient participation and written materials to support subject.  Instructors co-teach with [facility]HA video for hands only CPR.  Participants get hands on experience with mannequins.   Nutrition I class: Heart Healthy Eating:  -Group instruction provided by PowerPoint slides, verbal discussion, and written materials to support subject matter. The instructor gives an explanation and review of the Therapeutic Lifestyle Changes diet recommendations, which includes a discussion on lipid goals, dietary fat, sodium, fiber, plant stanol/sterol esters, sugar, and the components of a well-balanced, healthy diet. Flowsheet Row CARDIAC REHAB PHASE II EXERCISE from 06/28/2016 in [place]  Date  06/28/16  Educator  RD  Instruction Review Code  Not applicable [class handouts given]      Nutrition II class: Lifestyle Skills:  -Group instruction provided by PowerPoint slides, verbal discussion, and written materials to support subject matter. The instructor gives an explanation and review of label reading,  grocery shopping for heart health, heart healthy recipe modifications, and ways to make healthier choices when eating out. Flowsheet Row CARDIAC REHAB PHASE II EXERCISE from 06/28/2016 in [place]  Date  06/28/16  Educator  RD  Instruction Review Code  Not applicable [class handouts given]      Diabetes Question & Answer:  -Group instruction provided by PowerPoint slides, verbal discussion, and written materials to support subject  matter. The instructor gives an explanation and review of diabetes co-morbidities, pre- and post-prandial blood glucose goals, pre-exercise blood glucose goals, signs, symptoms, and treatment of hypoglycemia and hyperglycemia, and foot care basics.   Diabetes Blitz:  -Group instruction provided by PowerPoint slides, verbal discussion, and written materials to support subject matter. The instructor gives an explanation and review of the physiology behind type 1 and type 2 diabetes, diabetes medications and rational behind using different medications, pre- and post-prandial blood glucose recommendations and Hemoglobin A1c goals, diabetes diet, and exercise including blood glucose guidelines for exercising safely.    Portion Distortion:  -Group instruction provided by PowerPoint slides, verbal discussion, written materials, and food models to support subject matter. The instructor gives an explanation of serving size versus portion size, changes in portions sizes over the last 20 years, and what consists of a serving from each food group.   Stress Management:  -Group instruction provided by verbal instruction, video, and written materials to support subject matter.  Instructors review role of stress in heart disease and how to cope with stress positively.   Flowsheet Row CARDIAC REHAB PHASE II EXERCISE from 06/28/2016 in [place]  Date  04/07/16  Instruction Review Code  2- meets goals/outcomes       Exercising on Your Own:  -Group instruction provided by verbal instruction, power point, and written materials to support subject.  Instructors discuss benefits of exercise, components of exercise, frequency and intensity of exercise, and end points for exercise.  Also discuss use of nitroglycerin and activating EMS.  Review options of places to exercise outside of rehab.  Review guidelines for sex with heart disease.   Cardiac Drugs I:  -Group instruction provided by verbal instruction and written materials to support subject.  Instructor reviews cardiac drug classes: antiplatelets, anticoagulants, beta blockers, and statins.  Instructor discusses reasons, side effects, and lifestyle considerations for each drug class.   Cardiac Drugs II:  -Group instruction provided by verbal instruction and written materials to support subject.  Instructor reviews cardiac drug classes: angiotensin converting enzyme inhibitors (ACE-I), angiotensin II receptor blockers (ARBs), nitrates, and calcium channel blockers.  Instructor discusses reasons, side effects, and lifestyle considerations for each drug class. Flowsheet Row CARDIAC REHAB PHASE II EXERCISE from 06/28/2016 in [place]  Date  03/31/16  Educator  pharmacist  Instruction Review Code  2- meets goals/outcomes      Anatomy and Physiology of the Circulatory System:  -Group instruction provided by verbal instruction, video, and written materials to support subject.  Reviews functional anatomy of heart, how it relates to various diagnoses, and what role the heart plays in the overall system.   Knowledge Questionnaire Score:     Knowledge Questionnaire Score - 07/02/16 1534      Knowledge Questionnaire Score   Post Score 22/24      Core Components/Risk Factors/Patient Goals at Admission:     Personal Goals and Risk Factors at Admission - 03/02/16 1156      Core Components/Risk Factors/Patient Goals on  Admission   Hypertension Yes   Intervention Provide education on lifestyle modifcations including regular physical activity/exercise, weight management, moderate sodium restriction and increased consumption of fresh fruit, vegetables, and low fat dairy, alcohol moderation, and smoking cessation.;Monitor prescription use compliance.   Expected Outcomes Short Term: Continued assessment and intervention until BP is < 140/94m HG in hypertensive participants. < 130/874mHG in hypertensive participants with diabetes, heart failure or chronic kidney  disease.;Long Term: Maintenance of blood pressure at goal levels.   Lipids Yes   Intervention Provide education and support for participant on nutrition & aerobic/resistive exercise along with prescribed medications to achieve LDL <44m, HDL >449m   Expected Outcomes Short Term: Participant states understanding of desired cholesterol values and is compliant with medications prescribed. Participant is following exercise prescription and nutrition guidelines.;Long Term: Cholesterol controlled with medications as prescribed, with individualized exercise RX and with personalized nutrition plan. Value goals: LDL < 7062mHDL > 40 mg.   Stress Yes   Intervention Offer individual and/or small group education and counseling on adjustment to heart disease, stress management and health-related lifestyle change. Teach and support self-help strategies.;Refer participants experiencing significant psychosocial distress to appropriate mental health specialists for further evaluation and treatment. When possible, include family members and significant others in education/counseling sessions.   Expected Outcomes Short Term: Participant demonstrates changes in health-related behavior, relaxation and other stress management skills, ability to obtain effective social support, and compliance with psychotropic medications if prescribed.;Long Term: Emotional wellbeing is indicated by  absence of clinically significant psychosocial distress or social isolation.   Personal Goal Other Yes   Personal Goal Improve posture. Increase muscle tone. [facility] capabilities [facility]   Intervention Provide individulaized home exercise program with parameters for exercise, including stretching, aerobic exercise and resistance training to improve muscle tone and improve fitness.   [facility] Improved muscle tone measured by tricep skinfold measurement and grip strength.      Core Components/Risk Factors/Patient Goals Review:      Goals and Risk Factor Review    Row Name 03/31/16 1448 05/26/16 1728 06/22/16 1655 07/16/16 0831       Core Components/Risk Factors/Patient Goals Review   Personal Goals Review Increase Strength and Stamina;Other Other  -  -    Review Reviewed HEP with pt in which pt will walk at home to improve strength and stamina Reduced WL due to being inactive for ~ 4 weeks. Pt stated having better postural awareness. Discussed adding weights to exercise regimen to help improve postural muscles.. Pt has responded postively to exercise changes and WL increases. Pt stamina and strength has increased. Pt posture has also increased. Pt joined a new fitness facility. Pt has met personal and program goals. [facility] also improved in postural awareness and cardiorespiratory fitness.    Expected Outcomes Pt will improve cardiovascular fitness and functional capacity Pt will get back into exercise routine and improve in strength. Pt will continue to respond well to WL increases and exercise changes to build on cardiorespiratory fitness. Pt will also continue to build on postural muscles to maintain upright posture.  Pt will continue to improve in overall fitness        Core Components/Risk Factors/Patient Goals at Discharge (Final Review):      Goals and Risk Factor Review -  07/16/16 0831      Core Components/Risk Factors/Patient Goals Review   Review Pt has met personal and program goals. [facility] has also improved in postural awareness and cardiorespiratory fitness.   Expected Outcomes Pt will continue to improve in overall fitness       ITP Comments:     ITP Comments    Row Name 03/02/16 1148 06/25/16 1625         ITP Comments Dr. TraFransico Himedical  Director  Attended Hypertension education class, expected outcome met         Comments: Pt graduated from cardiac rehab program today with completion of 18 weeks participation in Phase II  exercise sessions.   Pt maintained poor  attendance due to respiratory infection with long recovery however progressed nicely during her  participation in rehab as evidenced by increased MET level.   Medication list reconciled. Repeat  PHQ score-  0.  Pt has made significant lifestyle changes and should be commended for her success. Pt feels she has achieved her  goals during cardiac rehab.   Pt plans to continue exercising on her own at [facility].

## 2016-07-19 DIAGNOSIS — Z7901 Long term (current) use of anticoagulants: Secondary | ICD-10-CM | POA: Diagnosis not present

## 2016-07-19 DIAGNOSIS — I480 Paroxysmal atrial fibrillation: Secondary | ICD-10-CM | POA: Diagnosis not present

## 2016-07-19 DIAGNOSIS — Z9889 Other specified postprocedural states: Secondary | ICD-10-CM | POA: Diagnosis not present

## 2016-07-19 DIAGNOSIS — I10 Essential (primary) hypertension: Secondary | ICD-10-CM | POA: Diagnosis not present

## 2016-07-27 DIAGNOSIS — I340 Nonrheumatic mitral (valve) insufficiency: Secondary | ICD-10-CM | POA: Diagnosis not present

## 2016-07-27 DIAGNOSIS — I6522 Occlusion and stenosis of left carotid artery: Secondary | ICD-10-CM | POA: Diagnosis not present

## 2016-08-13 DIAGNOSIS — H401133 Primary open-angle glaucoma, bilateral, severe stage: Secondary | ICD-10-CM | POA: Diagnosis not present

## 2016-08-17 DIAGNOSIS — Z7901 Long term (current) use of anticoagulants: Secondary | ICD-10-CM | POA: Diagnosis not present

## 2016-08-18 DIAGNOSIS — H401133 Primary open-angle glaucoma, bilateral, severe stage: Secondary | ICD-10-CM | POA: Diagnosis not present

## 2016-08-25 DIAGNOSIS — R21 Rash and other nonspecific skin eruption: Secondary | ICD-10-CM | POA: Diagnosis not present

## 2016-08-25 DIAGNOSIS — F419 Anxiety disorder, unspecified: Secondary | ICD-10-CM | POA: Diagnosis not present

## 2016-09-06 DIAGNOSIS — F5101 Primary insomnia: Secondary | ICD-10-CM | POA: Diagnosis not present

## 2016-09-06 DIAGNOSIS — G4700 Insomnia, unspecified: Secondary | ICD-10-CM | POA: Diagnosis not present

## 2016-09-06 DIAGNOSIS — R21 Rash and other nonspecific skin eruption: Secondary | ICD-10-CM | POA: Diagnosis not present

## 2016-09-14 DIAGNOSIS — Z7901 Long term (current) use of anticoagulants: Secondary | ICD-10-CM | POA: Diagnosis not present

## 2016-10-20 DIAGNOSIS — Z7901 Long term (current) use of anticoagulants: Secondary | ICD-10-CM | POA: Diagnosis not present

## 2016-10-20 DIAGNOSIS — Z5181 Encounter for therapeutic drug level monitoring: Secondary | ICD-10-CM | POA: Diagnosis not present

## 2016-10-20 DIAGNOSIS — I340 Nonrheumatic mitral (valve) insufficiency: Secondary | ICD-10-CM | POA: Diagnosis not present

## 2016-10-25 DIAGNOSIS — H401133 Primary open-angle glaucoma, bilateral, severe stage: Secondary | ICD-10-CM | POA: Diagnosis not present

## 2016-11-17 DIAGNOSIS — Z5181 Encounter for therapeutic drug level monitoring: Secondary | ICD-10-CM | POA: Diagnosis not present

## 2016-11-17 DIAGNOSIS — R791 Abnormal coagulation profile: Secondary | ICD-10-CM | POA: Diagnosis not present

## 2016-11-17 DIAGNOSIS — Z7901 Long term (current) use of anticoagulants: Secondary | ICD-10-CM | POA: Diagnosis not present

## 2016-11-17 DIAGNOSIS — I340 Nonrheumatic mitral (valve) insufficiency: Secondary | ICD-10-CM | POA: Diagnosis not present

## 2016-11-23 DIAGNOSIS — Z5181 Encounter for therapeutic drug level monitoring: Secondary | ICD-10-CM | POA: Diagnosis not present

## 2016-11-23 DIAGNOSIS — Z7901 Long term (current) use of anticoagulants: Secondary | ICD-10-CM | POA: Diagnosis not present

## 2016-12-23 DIAGNOSIS — I340 Nonrheumatic mitral (valve) insufficiency: Secondary | ICD-10-CM | POA: Diagnosis not present

## 2017-01-17 DIAGNOSIS — I10 Essential (primary) hypertension: Secondary | ICD-10-CM | POA: Diagnosis not present

## 2017-01-17 DIAGNOSIS — Z9889 Other specified postprocedural states: Secondary | ICD-10-CM | POA: Diagnosis not present

## 2017-01-17 DIAGNOSIS — Z7901 Long term (current) use of anticoagulants: Secondary | ICD-10-CM | POA: Diagnosis not present

## 2017-01-17 DIAGNOSIS — I480 Paroxysmal atrial fibrillation: Secondary | ICD-10-CM | POA: Diagnosis not present

## 2017-01-31 DIAGNOSIS — H401133 Primary open-angle glaucoma, bilateral, severe stage: Secondary | ICD-10-CM | POA: Diagnosis not present

## 2017-02-01 DIAGNOSIS — D485 Neoplasm of uncertain behavior of skin: Secondary | ICD-10-CM | POA: Diagnosis not present

## 2017-02-01 DIAGNOSIS — C44321 Squamous cell carcinoma of skin of nose: Secondary | ICD-10-CM | POA: Diagnosis not present

## 2017-02-01 DIAGNOSIS — L918 Other hypertrophic disorders of the skin: Secondary | ICD-10-CM | POA: Diagnosis not present

## 2017-02-03 DIAGNOSIS — I6523 Occlusion and stenosis of bilateral carotid arteries: Secondary | ICD-10-CM | POA: Diagnosis not present

## 2017-02-03 DIAGNOSIS — Z7901 Long term (current) use of anticoagulants: Secondary | ICD-10-CM | POA: Diagnosis not present

## 2017-02-10 DIAGNOSIS — E7800 Pure hypercholesterolemia, unspecified: Secondary | ICD-10-CM | POA: Diagnosis not present

## 2017-02-10 DIAGNOSIS — I480 Paroxysmal atrial fibrillation: Secondary | ICD-10-CM | POA: Diagnosis not present

## 2017-02-22 DIAGNOSIS — S39012A Strain of muscle, fascia and tendon of lower back, initial encounter: Secondary | ICD-10-CM | POA: Diagnosis not present

## 2017-02-22 DIAGNOSIS — J329 Chronic sinusitis, unspecified: Secondary | ICD-10-CM | POA: Diagnosis not present

## 2017-02-22 DIAGNOSIS — H6992 Unspecified Eustachian tube disorder, left ear: Secondary | ICD-10-CM | POA: Diagnosis not present

## 2017-03-07 DIAGNOSIS — Z95828 Presence of other vascular implants and grafts: Secondary | ICD-10-CM | POA: Diagnosis not present

## 2017-03-24 DIAGNOSIS — I340 Nonrheumatic mitral (valve) insufficiency: Secondary | ICD-10-CM | POA: Diagnosis not present

## 2017-04-06 DIAGNOSIS — C44321 Squamous cell carcinoma of skin of nose: Secondary | ICD-10-CM | POA: Diagnosis not present

## 2017-04-06 DIAGNOSIS — I340 Nonrheumatic mitral (valve) insufficiency: Secondary | ICD-10-CM | POA: Diagnosis not present

## 2017-04-15 DIAGNOSIS — H401133 Primary open-angle glaucoma, bilateral, severe stage: Secondary | ICD-10-CM | POA: Diagnosis not present

## 2017-04-27 DIAGNOSIS — I340 Nonrheumatic mitral (valve) insufficiency: Secondary | ICD-10-CM | POA: Diagnosis not present

## 2017-04-27 DIAGNOSIS — Z7901 Long term (current) use of anticoagulants: Secondary | ICD-10-CM | POA: Diagnosis not present

## 2017-04-27 DIAGNOSIS — Z5181 Encounter for therapeutic drug level monitoring: Secondary | ICD-10-CM | POA: Diagnosis not present

## 2017-06-13 ENCOUNTER — Other Ambulatory Visit: Payer: Self-pay | Admitting: Otolaryngology

## 2017-06-13 DIAGNOSIS — K225 Diverticulum of esophagus, acquired: Secondary | ICD-10-CM

## 2017-06-13 DIAGNOSIS — Z7901 Long term (current) use of anticoagulants: Secondary | ICD-10-CM

## 2017-06-13 DIAGNOSIS — R1310 Dysphagia, unspecified: Secondary | ICD-10-CM

## 2017-06-16 ENCOUNTER — Ambulatory Visit
Admission: RE | Admit: 2017-06-16 | Discharge: 2017-06-16 | Disposition: A | Discharge status: Home / Self Care | Payer: Medicare Other | Source: Ambulatory Visit | Attending: Otolaryngology | Admitting: Otolaryngology

## 2017-06-16 DIAGNOSIS — K225 Diverticulum of esophagus, acquired: Secondary | ICD-10-CM

## 2017-06-16 DIAGNOSIS — R1310 Dysphagia, unspecified: Secondary | ICD-10-CM

## 2017-06-16 DIAGNOSIS — Z7901 Long term (current) use of anticoagulants: Secondary | ICD-10-CM

## 2017-12-14 NOTE — H&P (Signed)
HPI:   Laura Knapp is a 67 y.o. female who presents as a consult Patient.   Referring Provider: Isabella Stalling, *  Chief complaint: Trouble swallowing.  HPI: She had a Zenker diverticulum diagnosed about 5 years ago. She did not have treatment at the time. She feels that her problems have gotten worse over the past couple of years. She has trouble swallowing, she has regurgitation of food. She has lost significant amount of weight but that was not due to the swallowing difficulty. That was related to mitral valve surgery she had a couple of years ago. She is currently anticoagulated on Coumadin for that.  PMH/Meds/All/SocHx/FamHx/ROS:   Past Medical History:  Diagnosis Date  . Acid reflux  . Atrial fibrillation ([place])  . Cataract  . Glaucoma  . Hepatitis  . Hypertension  . Insomnia  . Mitral regurgitation   Past Surgical History:  Procedure Laterality Date  . BREAST SURGERY  . CARDIAC SURGERY  . CATARACT EXTRACTION  Right eye  . glaucoma surgeries  Right eye  . HYSTERECTOMY  . knee surgert  . NOSE SURGERY   No family history of bleeding disorders, wound healing problems or difficulty with anesthesia.   Social History   Social History  . Marital status: Married  Spouse name: N/A  . Number of children: N/A  . Years of education: N/A   Occupational History  . Not on file.   Social History Main Topics  . Smoking status: Former Smoker  Quit date: 01/19/1990  . Smokeless tobacco: Never Used  . Alcohol use 3.0 oz/week  2 Shots of liquor (1.5 oz. of 80-proof spirits) per week  Comment: patient drinks 2 drinks before bed and occa a glass of wine  . Drug use: No  . Sexual activity: Not on file   Other Topics Concern  . Not on file   Social History Narrative  . No narrative on file   Current Outpatient Prescriptions:  . atorvastatin (LIPITOR) 40 MG tablet, Take 1 tablet by mouth daily., Disp: , Rfl:  . brimonidine (ALPHAGAN) 0.2 % ophthalmic solution,  Place 1 drop into both eyes 3 times daily., Disp: 45 mL, Rfl: 3 . DEXILANT 60 mg capsule, TAKE 1 CAPSULE DAILY, Disp: 90 capsule, Rfl: 0 . ezetimibe (ZETIA) 10 mg tablet, Take 10 mg by mouth daily., Disp: , Rfl:  . latanoprost (XALATAN) 0.005 % ophthalmic solution, INSTILL 1 DROP INTO BOTH EYES NIGHTLY, Disp: 7.5 mL, Rfl: 3 . LOSARTAN-HYDROCHLOROTHIAZIDE ORAL, Take by mouth., Disp: , Rfl:  . metoPROLOL succinate (TOPROL-XL) 50 MG 24 hr tablet, Take 50 mg by mouth daily., Disp: , Rfl:  . metoPROLOL tartrate (LOPRESSOR) 50 MG tablet, Take 1 tablet by mouth daily. , Disp: , Rfl:  . warfarin (COUMADIN) 5 MG tablet *ANTICOAGULANT*, Take 5 mg by mouth daily at 6pm., Disp: , Rfl:  . albuterol 90 mcg/actuation inhaler, Inhale 2 puffs into the lungs every 6 (six) hours as needed for Wheezing or Shortness of Breath., Disp: 18 g, Rfl: 0  A complete ROS was performed with pertinent positives/negatives noted in the HPI. The remainder of the ROS are negative.   Physical Exam:   Ht 1.626 m (5\' 4" )  Wt 57.2 kg (126 lb)  LMP (LMP Unknown)  BMI 21.63 kg/m   General: Healthy and alert, in no distress, breathing easily. Normal affect. In a pleasant mood. Head: Normocephalic, atraumatic. No masses, or scars. Eyes: Pupils are equal, and reactive to light. Vision is grossly intact. No spontaneous  or gaze nystagmus. Ears: Ear canals are clear. Tympanic membranes are intact, with normal landmarks and the middle ears are clear and healthy. Hearing: Grossly normal. Nose: Nasal cavities are clear with healthy mucosa, no polyps or exudate.Airways are patent. Face: No masses or scars, facial nerve function is symmetric. Oral Cavity: No mucosal abnormalities are noted. Tongue with normal mobility. Dentition appears healthy. Oropharynx: Tonsils are symmetric. There are no mucosal masses identified. Tongue base appears normal and healthy. Larynx/Hypopharynx: indirect exam reveals healthy, mobile vocal cords, without  mucosal lesions in the hypopharynx or larynx. Chest: Deferred Neck: No palpable masses, no cervical adenopathy, no thyroid nodules or enlargement. Neuro: Cranial nerves II-XII will normal function. Balance: Normal gate. Other findings: none.  Independent Review of Additional Tests or Records:  none  Procedures:  none  Impression & Plans:  Normal head and neck exam. Recommend we do another barium swallow to evaluate the Zenker's. We will discuss surgical intervention following that.We discussed causes of reflux, including lifestyle and dietary factors. Recommend strict avoidance of all tobacco, caffeine, alcohol, chocolate and peppermint. A reflux handout with more detailed instructions was provided to the patient.

## 2017-12-16 ENCOUNTER — Encounter (HOSPITAL_COMMUNITY): Payer: Self-pay

## 2017-12-16 ENCOUNTER — Encounter (HOSPITAL_COMMUNITY)
Admission: RE | Admit: 2017-12-16 | Discharge: 2017-12-16 | Disposition: A | Discharge status: Home / Self Care | Payer: Medicare Other | Source: Ambulatory Visit | Attending: Otolaryngology | Admitting: Otolaryngology

## 2017-12-16 DIAGNOSIS — Z79899 Other long term (current) drug therapy: Secondary | ICD-10-CM | POA: Diagnosis not present

## 2017-12-16 DIAGNOSIS — Z01812 Encounter for preprocedural laboratory examination: Secondary | ICD-10-CM | POA: Insufficient documentation

## 2017-12-16 DIAGNOSIS — Z85828 Personal history of other malignant neoplasm of skin: Secondary | ICD-10-CM | POA: Insufficient documentation

## 2017-12-16 DIAGNOSIS — I272 Pulmonary hypertension, unspecified: Secondary | ICD-10-CM | POA: Diagnosis not present

## 2017-12-16 DIAGNOSIS — K225 Diverticulum of esophagus, acquired: Secondary | ICD-10-CM | POA: Diagnosis not present

## 2017-12-16 DIAGNOSIS — I480 Paroxysmal atrial fibrillation: Secondary | ICD-10-CM | POA: Insufficient documentation

## 2017-12-16 DIAGNOSIS — K219 Gastro-esophageal reflux disease without esophagitis: Secondary | ICD-10-CM | POA: Diagnosis not present

## 2017-12-16 DIAGNOSIS — I10 Essential (primary) hypertension: Secondary | ICD-10-CM | POA: Insufficient documentation

## 2017-12-16 HISTORY — DX: Malignant (primary) neoplasm, unspecified: C80.1

## 2017-12-16 HISTORY — DX: Dyspnea, unspecified: R06.00

## 2017-12-16 HISTORY — DX: Gastro-esophageal reflux disease without esophagitis: K21.9

## 2017-12-16 HISTORY — DX: Unspecified osteoarthritis, unspecified site: M19.90

## 2017-12-16 HISTORY — DX: Unspecified glaucoma: H40.9

## 2017-12-16 HISTORY — DX: Unspecified visual disturbance: H53.9

## 2017-12-16 HISTORY — DX: Inflammatory liver disease, unspecified: K75.9

## 2017-12-16 HISTORY — DX: Unspecified hearing loss, unspecified ear: H91.90

## 2017-12-16 LAB — CBC
HCT: 34.9 % — ABNORMAL LOW (ref 36.0–46.0)
Hemoglobin: 11.6 g/dL — ABNORMAL LOW (ref 12.0–15.0)
MCH: 29.4 pg (ref 26.0–34.0)
MCHC: 33.2 g/dL (ref 30.0–36.0)
MCV: 88.6 fL (ref 78.0–100.0)
Platelets: 233 10*3/uL (ref 150–400)
RBC: 3.94 MIL/uL (ref 3.87–5.11)
RDW: 13.5 % (ref 11.5–15.5)
WBC: 3.8 10*3/uL — ABNORMAL LOW (ref 4.0–10.5)

## 2017-12-16 LAB — BASIC METABOLIC PANEL
Anion gap: 8 (ref 5–15)
BUN: 17 mg/dL (ref 8–23)
CO2: 22 mmol/L (ref 22–32)
Calcium: 8.9 mg/dL (ref 8.9–10.3)
Chloride: 101 mmol/L (ref 98–111)
Creatinine, Ser: 1.29 mg/dL — ABNORMAL HIGH (ref 0.44–1.00)
GFR calc Af Amer: 48 mL/min — ABNORMAL LOW (ref >60)
GFR calc non Af Amer: 42 mL/min — ABNORMAL LOW (ref >60)
Glucose, Bld: 98 mg/dL (ref 70–99)
Potassium: 5 mmol/L (ref 3.5–5.1)
Sodium: 131 mmol/L — ABNORMAL LOW (ref 135–145)

## 2017-12-16 NOTE — Pre-Procedure Instructions (Signed)
[name]  12/16/2017     Your procedure is scheduled on Wednesday, July 31.  Report to [facility] Admitting at 5:30 AM               Your surgery or procedure is scheduled for 7:30 A.M   Call this number if you have problems the morning of surgery: [phone]  This is the number for the Pre- Surgical Desk.     Remember:  Do not eat or drink after midnight Tuesday, July 30.   Take these medicines the morning of surgery with A SIP OF WATER : metoprolol succinate (TOPROL-XL) sotalol (BETAPACE)  Use Eye Drops   May take if needed: acetaminophen (TYLENOL)  cyclobenzaprine (FLEXERIL)  phenhydrAMINE (BENADRYL)  traMADol (ULTRAM)  Follow your surgeon's instructions regarding holding or continuing Coumadin   STOP taking Aspirin, Aspirin Products (Goody Powder, Excedrin Migraine), Ibuprofen (Advil), Naproxen (Aleve), Vitamins and Herbal Products (ie Fish Oil) Special instructions:   [facility]- Preparing For Surgery  Before surgery, you can play an important role. Because skin is not sterile, your skin needs to be as free of germs as possible. You can reduce the number of germs on your skin by washing with CHG (chlorahexidine gluconate) Soap before surgery.  CHG is an antiseptic cleaner which kills germs and bonds with the skin to continue killing germs even after washing.    Oral Hygiene is also important to reduce your risk of infection.  Remember - BRUSH YOUR TEETH THE MORNING OF SURGERY WITH YOUR REGULAR TOOTHPASTE  Please do not use if you have an allergy to CHG or antibacterial soaps. If your skin becomes reddened/irritated stop using the CHG.  Do not shave (including legs and underarms) for at least 48 hours prior to first CHG shower. It is OK to shave your face.  Please follow these instructions carefully.   1. Shower the NIGHT BEFORE SURGERY and the MORNING OF SURGERY with CHG.   2. If you chose to wash your hair, wash your hair first as usual with  your normal shampoo.  3. After you shampoo, rinse your hair and body thoroughly to remove the shampoo.      Wash your face and private area with the soap you use at home, then rinse.  4. Use CHG as you would any other liquid soap. You can apply CHG directly to the skin and wash gently with a scrungie or a clean washcloth.   5. Apply the CHG Soap to your body ONLY FROM THE NECK DOWN.  Do not use on open wounds or open sores. Avoid contact with your eyes, ears, mouth and genitals (private parts).  6. Wash thoroughly, paying special attention to the area where your surgery will be performed.  7. Thoroughly rinse your body with warm water from the neck down.  8. DO NOT shower/wash with your normal soap after using and rinsing off the CHG Soap.  9. Pat yourself dry with a CLEAN TOWEL.  10. Wear CLEAN PAJAMAS to bed the night before surgery, wear comfortable clothes the morning of surgery  11. Place CLEAN SHEETS on your bed the night of your first shower and DO NOT SLEEP WITH PETS.  Day of Surgery: Shower as above  Do not wear lotions, powders, or perfumes, or deodorant. Please wear clean clothes to the hospital/surgery center.   Remember to brush your teeth WITH YOUR REGULAR TOOTHPASTE  Do not wear jewelry, make-up or nail polish.  Do not  shave 48 hours prior to surgery.    Do not bring valuables to the hospital.  [facility] is not responsible for any belongings or valuables.  Contacts, dentures or bridgework may not be worn into surgery.  Leave your suitcase in the car.  After surgery it may be brought to your room.  For patients admitted to the hospital, discharge time will be determined by your treatment team.  Patients discharged the day of surgery will not be allowed to drive home.   Please read over the following fact sheets that you were given: Coughing and Deep Breathing. Pain Booklet, Surgical Site Infection

## 2017-12-16 NOTE — Progress Notes (Addendum)
Laura Knapp denies chest pain and is only short of breath with exertion.  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 Patient reports that she has limited time for exercise. PCP is Kerrie Buffalo with [facility] in [place].  Cardiologist is Dr Einar Gip.  Laura Knapp reports that she saw Dr Einar Gip last August and next appointment is in August of this year.  Patient reports that she has ok by Dr Einar Gip to stop Coumadin 5 days prior to surgery, last dose was 12/15/17. PT/INR will be drawn the morning of surgery. I have requested records from Dr Einar Gip to include last office not, clearance to hold Coumadin, EKG and Echo , if one was done in his office.  Laura Knapp had Mitral Valve Repair in 2017 at [facility], records requested.Laura Knapp may not be able to take medications the am of surgery : Metoprolol, Satlol; due to swallowing issues and limiting water and not being able to take them in food. the day of surgery. I instructed patient to try with a small amount of water 1 time and if unable to inform  Nurse the morning of surgery. BP today 135/56 HR 50.  BP from PCP's office since June have averaged between 100/56-124/50, Heart Rate 50- 70.

## 2017-12-19 NOTE — Progress Notes (Signed)
Anesthesia Chart Review:   Case:  [id number] Date/Time:  12/21/17 0715   Procedure:  Stanford Breed DIVERTICULECTOMY ENDOSCOPIC (N/A )   Anesthesia type:  General   Pre-op diagnosis:  Diverticulum of Esophagus   Location:  [facility] OR ROOM 02 / [facility]   Surgeon:  Izora Gala, MD      DISCUSSION: 67 yo female former smoker for above procedure. Pertinent hx includes HTN, Paroxysmal Afib, Pulmonary HTN, GERD, HOH, Glaucoma, Carotid stenosis, DOE, Arthritis, s/p Mitral Valve Repair 11/25/2016.  Pt has cardiac clearance on chart from Dr. Vernell Leep which notes to stop Coumadin 5d prior to surgery and restart as soon as possible.  Anticipate she can proceed with surgery as planned barring acute status change  VS: BP (!) 137/56   Pulse (!) 50   Temp 36.4 C   Resp 18   Ht 5\' 4"  (1.626 m)   Wt 112 lb 6.4 oz (51 kg)   SpO2 97%   BMI 19.29 kg/m   PROVIDERS: Aletha Halim., PA-C is PCP  Kela Millin, MD is Cardiologist last seen 01/17/2017   LABS: Labs reviewed: Acceptable for surgery. (all labs ordered are listed, but only abnormal results are displayed)  Labs Reviewed  BASIC METABOLIC PANEL - Abnormal; Notable for the following components:      Result Value   Sodium 131 (*)    Creatinine, Ser 1.29 (*)    GFR calc non Af Amer 42 (*)    GFR calc Af Amer 48 (*)    All other components within normal limits  CBC - Abnormal; Notable for the following components:   WBC 3.8 (*)    Hemoglobin 11.6 (*)    HCT 34.9 (*)    All other components within normal limits     IMAGES: N/A  EKG: 01/17/2017 (outside record copy in pt chart): NSR  CV:  Echo 02/19/16 (outside record copy in pt chart): 1.  Left ventricle cavity is normal in size.  Normal global wall motion.  Visual EF is 65 to 70%.  Calculated EF 64%. 2.  Left atrial cavity is mildly dilated. 3.  Trace aortic regurgitation. 4.  Echos of mitral valve repair ring are visible.  Mild to moderate mitral regurgitation anteriorly  directed jet.  Mild mitral valve stenosis with mean gradient of 5.9 mmHg, calculated mitral valve area 2.17 m.  E wave dominant mitral inflow. 5.  Mild tricuspid regurgitation.  No evidence of pulmonary hypertension. 6.  IVC is normal with a respiratory response of less than 50%. 7.  c.f.echo on  07/24/2015 and TEE 09/09/2015, mitral valve has been repaired.  Coronary angiogram 10/16/2015 (outside record copy in pt chart): Normal coronary arteries, mild coronary calcifications.  3+ MR.  Mild pulmonary hypertension with right heart suggestive of moderate to severe MR.  Preserved CO/CI by Fick.  Nuclear Stress 09/22/2015 (outside record copy in pt chart): 1.  Resting EKG demonstrated normal sinus rhythm, normal axis.  No evidence of ischemia.  Stress EKG is positive for myocardial ischemia.  With exercise there is frequent PVCs, ventricular couplets and ventricular complaints, associated with 2 mm horizontal ST segment depression in the inferior and lateral leads.  ST segment changes persisted for greater than 3 minutes in recovery.  Stress terminated due to fatigue.  Patient performed from a exercise using a Bruce protocol, completing 6 minutes.  The patient completed an estimated workload of 7.05 minutes. 2.  Myocardial perfusion imaging is normal.  Overall left ventricular systolic function was  normal without regional wall motion abnormalities.  Left ventricular ejection fraction was 89%.  This is an intermediate risk study, clinical correlation recommended.  This is an intermediate risk study due to arrhythmias at ST changes of ischemia.  Multivessel disease and balanced ischemia cannot be excluded.  Consider further cardiac work-up.  Carotid doppler 07/24/2015 (outside record copy in pt chart): Stenosis in the left internal carotid artery 16 to 49%, upper end of spectrum.  Antegrade right vertebral artery flow.  Antegrade left vertebral artery flow.  Follow-up in 1 year is appropriate if clinically  indicated.    Past Medical History:  Diagnosis Date  . Arrhythmia    atrial fibrillation  . Arthritis    Back  . Atrial fibrillation ([place])   . Cancer ([place])    skin cancer nose- squamous  . 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 Dyspnea    with exertion  . GERD (gastroesophageal reflux disease)   . Glaucoma   . Heart murmur   . Hepatitis    as a child  . HOH (hard of hearing)   . Hypertension   . Pulmonary hypertension ([place])   . 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 ABDOMINAL HYSTERECTOMY     partial  . BREAST LUMPECTOMY     BOTH BREAST  . CARDIAC CATHETERIZATION N/A 10/16/2015   Procedure: Right/Left Heart Cath and Coronary Angiography;  Surgeon: Adrian Prows, MD;  Location: [place] [facility];  Service: Cardiovascular;  Laterality: N/A;  . COLONOSCOPY    . EYE SURGERY Right    x 3 for Glaucoma  . MITRAL VALVE REPAIR  11/26/2015   [facility]   . PERIPHERAL VASCULAR CATHETERIZATION  10/16/2015   Procedure: Thoracic Aortogram;  Surgeon: Adrian Prows, MD;  Location: [place] [facility];  Service: Cardiovascular;;  . SKIN CANCER EXCISION     nose  . TEE WITHOUT CARDIOVERSION N/A 09/09/2015   Procedure: TRANSESOPHAGEAL ECHOCARDIOGRAM (TEE);  Surgeon: Adrian Prows, MD;  Location: [place];  Service: Cardiovascular;  Laterality: N/A;  . TUBAL LIGATION  1983    MEDICATIONS: . ENSURE (ENSURE)  . pilocarpine (PILOCAR) 4 % ophthalmic solution  . acetaminophen (TYLENOL) 325 MG tablet  . atorvastatin (LIPITOR) 40 MG tablet  . brimonidine (ALPHAGAN) 0.2 % ophthalmic solution  . Cyanocobalamin (B-12) 1000 MCG CAPS  . cyclobenzaprine (FLEXERIL) 10 MG tablet  . dexlansoprazole (DEXILANT) 60 MG capsule  . diphenhydrAMINE (BENADRYL) 25 MG tablet  . dorzolamide-timolol (COSOPT) 22.3-6.8 MG/ML ophthalmic solution  . latanoprost (XALATAN) 0.005  % ophthalmic solution  . metoprolol succinate (TOPROL-XL) 50 MG 24 hr tablet  . olmesartan-hydrochlorothiazide (BENICAR HCT) 20-12.5 MG tablet  . pilocarpine (PILOCAR) 1 % ophthalmic solution  . sotalol (BETAPACE) 160 MG tablet  . traMADol (ULTRAM) 50 MG tablet  . warfarin (COUMADIN) 5 MG tablet  . ZETIA 10 MG tablet  . zolpidem (AMBIEN) 10 MG tablet   No current facility-administered medications for this encounter.     Wynonia Musty [facility] Short Stay Center/Anesthesiology Phone [phone] 12/19/2017 10:34 AM

## 2017-12-21 ENCOUNTER — Encounter (HOSPITAL_COMMUNITY)
Admission: RE | Disposition: A | Discharge status: Home / Self Care | Payer: Self-pay | Source: Ambulatory Visit | Attending: Otolaryngology

## 2017-12-21 ENCOUNTER — Ambulatory Visit (HOSPITAL_COMMUNITY): Payer: Medicare Other | Admitting: Certified Registered Nurse Anesthetist

## 2017-12-21 ENCOUNTER — Observation Stay (HOSPITAL_COMMUNITY)
Admission: RE | Admit: 2017-12-21 | Discharge: 2017-12-22 | Disposition: A | Discharge status: Home / Self Care | Payer: Medicare Other | Source: Ambulatory Visit | Attending: Otolaryngology | Admitting: Otolaryngology

## 2017-12-21 ENCOUNTER — Other Ambulatory Visit: Payer: Self-pay

## 2017-12-21 ENCOUNTER — Ambulatory Visit (HOSPITAL_COMMUNITY): Payer: Medicare Other | Admitting: Physician Assistant

## 2017-12-21 ENCOUNTER — Encounter (HOSPITAL_COMMUNITY): Payer: Self-pay | Admitting: *Deleted

## 2017-12-21 DIAGNOSIS — G4700 Insomnia, unspecified: Secondary | ICD-10-CM | POA: Insufficient documentation

## 2017-12-21 DIAGNOSIS — Z90710 Acquired absence of both cervix and uterus: Secondary | ICD-10-CM | POA: Diagnosis not present

## 2017-12-21 DIAGNOSIS — I340 Nonrheumatic mitral (valve) insufficiency: Secondary | ICD-10-CM | POA: Insufficient documentation

## 2017-12-21 DIAGNOSIS — Z888 Allergy status to other drugs, medicaments and biological substances status: Secondary | ICD-10-CM | POA: Insufficient documentation

## 2017-12-21 DIAGNOSIS — Z7951 Long term (current) use of inhaled steroids: Secondary | ICD-10-CM | POA: Diagnosis not present

## 2017-12-21 DIAGNOSIS — K219 Gastro-esophageal reflux disease without esophagitis: Secondary | ICD-10-CM | POA: Insufficient documentation

## 2017-12-21 DIAGNOSIS — M1990 Unspecified osteoarthritis, unspecified site: Secondary | ICD-10-CM | POA: Insufficient documentation

## 2017-12-21 DIAGNOSIS — I739 Peripheral vascular disease, unspecified: Secondary | ICD-10-CM | POA: Insufficient documentation

## 2017-12-21 DIAGNOSIS — I10 Essential (primary) hypertension: Secondary | ICD-10-CM | POA: Insufficient documentation

## 2017-12-21 DIAGNOSIS — I4891 Unspecified atrial fibrillation: Secondary | ICD-10-CM | POA: Diagnosis not present

## 2017-12-21 DIAGNOSIS — H409 Unspecified glaucoma: Secondary | ICD-10-CM | POA: Insufficient documentation

## 2017-12-21 DIAGNOSIS — Z885 Allergy status to narcotic agent status: Secondary | ICD-10-CM | POA: Insufficient documentation

## 2017-12-21 DIAGNOSIS — Z7901 Long term (current) use of anticoagulants: Secondary | ICD-10-CM | POA: Insufficient documentation

## 2017-12-21 DIAGNOSIS — Z79899 Other long term (current) drug therapy: Secondary | ICD-10-CM | POA: Diagnosis not present

## 2017-12-21 DIAGNOSIS — Z87891 Personal history of nicotine dependence: Secondary | ICD-10-CM | POA: Insufficient documentation

## 2017-12-21 DIAGNOSIS — K225 Diverticulum of esophagus, acquired: Principal | ICD-10-CM | POA: Diagnosis present

## 2017-12-21 DIAGNOSIS — Z9841 Cataract extraction status, right eye: Secondary | ICD-10-CM | POA: Diagnosis not present

## 2017-12-21 HISTORY — PX: ZENKER'S DIVERTICULECTOMY ENDOSCOPIC: SHX5224

## 2017-12-21 HISTORY — PX: ZENKER'S DIVERTICULECTOMY ENDOSCOPIC: SHX6191

## 2017-12-21 LAB — CBC
HCT: 32.1 % — ABNORMAL LOW (ref 36.0–46.0)
Hemoglobin: 10.7 g/dL — ABNORMAL LOW (ref 12.0–15.0)
MCH: 29.1 pg (ref 26.0–34.0)
MCHC: 33.3 g/dL (ref 30.0–36.0)
MCV: 87.2 fL (ref 78.0–100.0)
Platelets: 145 10*3/uL — ABNORMAL LOW (ref 150–400)
RBC: 3.68 MIL/uL — ABNORMAL LOW (ref 3.87–5.11)
RDW: 13.2 % (ref 11.5–15.5)
WBC: 5.8 10*3/uL (ref 4.0–10.5)

## 2017-12-21 LAB — PROTIME-INR
INR: 1.11
Prothrombin Time: 14.2 seconds (ref 11.4–15.2)

## 2017-12-21 SURGERY — ZENKER'S DIVERTICULECTOMY ENDOSCOPIC
Anesthesia: General | Site: Throat

## 2017-12-21 MED ORDER — FENTANYL CITRATE (PF) 100 MCG/2ML IJ SOLN
25.0000 ug | INTRAMUSCULAR | Status: DC | PRN
  Administered 2017-12-21: 25 ug via INTRAVENOUS

## 2017-12-21 MED ORDER — WARFARIN SODIUM 5 MG PO TABS
5.0000 mg | ORAL_TABLET | Freq: Once | ORAL | Status: AC
  Administered 2017-12-21: 5 mg via ORAL
  Filled 2017-12-21: qty 1

## 2017-12-21 MED ORDER — MIDAZOLAM HCL 2 MG/2ML IJ SOLN
INTRAMUSCULAR | Status: AC
  Filled 2017-12-21: qty 2

## 2017-12-21 MED ORDER — WARFARIN - PHARMACIST DOSING INPATIENT
Freq: Every day | Status: DC
  Administered 2017-12-21: 17:00:00

## 2017-12-21 MED ORDER — PILOCARPINE HCL 4 % OP SOLN: 1.0000 [drp] | Freq: Three times a day (TID) | OPHTHALMIC | Status: DC

## 2017-12-21 MED ORDER — HYDROCHLOROTHIAZIDE 12.5 MG PO CAPS
12.5000 mg | ORAL_CAPSULE | Freq: Every day | ORAL | Status: DC
  Administered 2017-12-22: 12.5 mg via ORAL
  Filled 2017-12-21: qty 1

## 2017-12-21 MED ORDER — FENTANYL CITRATE (PF) 100 MCG/2ML IJ SOLN
INTRAMUSCULAR | Status: DC | PRN
  Administered 2017-12-21 (×2): 50 ug via INTRAVENOUS

## 2017-12-21 MED ORDER — B-12 1000 MCG PO CAPS: 1000.0000 ug | ORAL_CAPSULE | ORAL | Status: DC

## 2017-12-21 MED ORDER — FENTANYL CITRATE (PF) 250 MCG/5ML IJ SOLN
INTRAMUSCULAR | Status: AC
  Filled 2017-12-21: qty 5

## 2017-12-21 MED ORDER — EPHEDRINE SULFATE 50 MG/ML IJ SOLN: INTRAMUSCULAR | Status: DC | PRN

## 2017-12-21 MED ORDER — EZETIMIBE 10 MG PO TABS
10.0000 mg | ORAL_TABLET | Freq: Every evening | ORAL | Status: DC
  Administered 2017-12-21: 10 mg via ORAL
  Filled 2017-12-21: qty 1

## 2017-12-21 MED ORDER — PROPOFOL 10 MG/ML IV BOLUS
INTRAVENOUS | Status: AC
  Filled 2017-12-21: qty 40

## 2017-12-21 MED ORDER — LACTATED RINGERS IV SOLN
INTRAVENOUS | Status: DC | PRN
  Administered 2017-12-21 (×2): via INTRAVENOUS

## 2017-12-21 MED ORDER — EPHEDRINE 5 MG/ML INJ
INTRAVENOUS | Status: AC
  Filled 2017-12-21: qty 10

## 2017-12-21 MED ORDER — PROPOFOL 10 MG/ML IV BOLUS
INTRAVENOUS | Status: DC | PRN
  Administered 2017-12-21: 90 mg via INTRAVENOUS

## 2017-12-21 MED ORDER — WARFARIN SODIUM 2.5 MG PO TABS: 2.5000 mg | ORAL_TABLET | ORAL | Status: DC

## 2017-12-21 MED ORDER — LATANOPROST 0.005 % OP SOLN
1.0000 [drp] | Freq: Every day | OPHTHALMIC | Status: DC
  Administered 2017-12-21: 1 [drp] via OPHTHALMIC
  Filled 2017-12-21: qty 2.5

## 2017-12-21 MED ORDER — DIPHENHYDRAMINE HCL 25 MG PO TABS
25.0000 mg | ORAL_TABLET | Freq: Every day | ORAL | Status: DC | PRN
  Filled 2017-12-21: qty 1

## 2017-12-21 MED ORDER — SOTALOL HCL 80 MG PO TABS
320.0000 mg | ORAL_TABLET | Freq: Two times a day (BID) | ORAL | Status: DC
  Administered 2017-12-22: 320 mg via ORAL
  Filled 2017-12-21: qty 1

## 2017-12-21 MED ORDER — BRIMONIDINE TARTRATE 0.2 % OP SOLN
1.0000 [drp] | Freq: Three times a day (TID) | OPHTHALMIC | Status: DC
  Administered 2017-12-21 – 2017-12-22 (×3): 1 [drp] via OPHTHALMIC
  Filled 2017-12-21 (×2): qty 5

## 2017-12-21 MED ORDER — TIMOLOL MALEATE 0.5 % OP SOLN
1.0000 [drp] | Freq: Two times a day (BID) | OPHTHALMIC | Status: DC
  Filled 2017-12-21: qty 5

## 2017-12-21 MED ORDER — ONDANSETRON HCL 4 MG/2ML IJ SOLN
INTRAMUSCULAR | Status: AC
  Filled 2017-12-21: qty 2

## 2017-12-21 MED ORDER — HYDROCODONE-ACETAMINOPHEN 7.5-325 MG/15ML PO SOLN
7.5000 mL | ORAL | Status: DC | PRN
  Administered 2017-12-21: 7.5 mL via ORAL
  Filled 2017-12-21: qty 15

## 2017-12-21 MED ORDER — DORZOLAMIDE HCL 2 % OP SOLN
1.0000 [drp] | Freq: Two times a day (BID) | OPHTHALMIC | Status: DC
  Administered 2017-12-22: 1 [drp] via OPHTHALMIC
  Filled 2017-12-21: qty 10

## 2017-12-21 MED ORDER — OLMESARTAN MEDOXOMIL-HCTZ 20-12.5 MG PO TABS: 1.0000 | ORAL_TABLET | Freq: Every day | ORAL | Status: DC

## 2017-12-21 MED ORDER — DIPHENHYDRAMINE HCL 25 MG PO CAPS: 25.0000 mg | ORAL_CAPSULE | Freq: Every day | ORAL | Status: DC | PRN

## 2017-12-21 MED ORDER — METOPROLOL SUCCINATE ER 50 MG PO TB24
50.0000 mg | ORAL_TABLET | Freq: Every day | ORAL | Status: DC
  Administered 2017-12-22: 50 mg via ORAL
  Filled 2017-12-21: qty 1

## 2017-12-21 MED ORDER — TRAMADOL HCL 50 MG PO TABS: 50.0000 mg | ORAL_TABLET | Freq: Every day | ORAL | Status: DC | PRN

## 2017-12-21 MED ORDER — SUGAMMADEX SODIUM 200 MG/2ML IV SOLN
INTRAVENOUS | Status: AC
  Filled 2017-12-21: qty 2

## 2017-12-21 MED ORDER — ATORVASTATIN CALCIUM 40 MG PO TABS
40.0000 mg | ORAL_TABLET | Freq: Every evening | ORAL | Status: DC
  Filled 2017-12-21: qty 1

## 2017-12-21 MED ORDER — DEXAMETHASONE SODIUM PHOSPHATE 10 MG/ML IJ SOLN
INTRAMUSCULAR | Status: AC
  Filled 2017-12-21: qty 1

## 2017-12-21 MED ORDER — ONDANSETRON HCL 4 MG/2ML IJ SOLN
INTRAMUSCULAR | Status: DC | PRN
  Administered 2017-12-21: 4 mg via INTRAVENOUS

## 2017-12-21 MED ORDER — PHENYLEPHRINE 40 MCG/ML (10ML) SYRINGE FOR IV PUSH (FOR BLOOD PRESSURE SUPPORT)
PREFILLED_SYRINGE | INTRAVENOUS | Status: DC | PRN
  Administered 2017-12-21: 80 ug via INTRAVENOUS

## 2017-12-21 MED ORDER — DORZOLAMIDE HCL-TIMOLOL MAL 2-0.5 % OP SOLN: 1.0000 [drp] | Freq: Two times a day (BID) | OPHTHALMIC | Status: DC

## 2017-12-21 MED ORDER — OXYCODONE HCL 5 MG/5ML PO SOLN: 5.0000 mg | Freq: Once | ORAL | Status: DC | PRN

## 2017-12-21 MED ORDER — FENTANYL CITRATE (PF) 100 MCG/2ML IJ SOLN
INTRAMUSCULAR | Status: AC
  Filled 2017-12-21: qty 2

## 2017-12-21 MED ORDER — OXYCODONE HCL 5 MG PO TABS: 5.0000 mg | ORAL_TABLET | Freq: Once | ORAL | Status: DC | PRN

## 2017-12-21 MED ORDER — PHENYLEPHRINE 40 MCG/ML (10ML) SYRINGE FOR IV PUSH (FOR BLOOD PRESSURE SUPPORT)
PREFILLED_SYRINGE | INTRAVENOUS | Status: AC
Start: 2017-12-21 — End: ?
  Filled 2017-12-21: qty 10

## 2017-12-21 MED ORDER — EPHEDRINE SULFATE-NACL 50-0.9 MG/10ML-% IV SOSY
PREFILLED_SYRINGE | INTRAVENOUS | Status: DC | PRN
  Administered 2017-12-21 (×2): 5 mg via INTRAVENOUS

## 2017-12-21 MED ORDER — SUCCINYLCHOLINE CHLORIDE 200 MG/10ML IV SOSY
PREFILLED_SYRINGE | INTRAVENOUS | Status: AC
  Filled 2017-12-21: qty 10

## 2017-12-21 MED ORDER — ACETAMINOPHEN 325 MG PO TABS: 325.0000 mg | ORAL_TABLET | Freq: Every day | ORAL | Status: DC | PRN

## 2017-12-21 MED ORDER — SUCCINYLCHOLINE CHLORIDE 200 MG/10ML IV SOSY
PREFILLED_SYRINGE | INTRAVENOUS | Status: DC | PRN
  Administered 2017-12-21: 50 mg via INTRAVENOUS

## 2017-12-21 MED ORDER — MIDAZOLAM HCL 2 MG/2ML IJ SOLN
INTRAMUSCULAR | Status: DC | PRN
  Administered 2017-12-21: 1 mg via INTRAVENOUS

## 2017-12-21 MED ORDER — POTASSIUM CHLORIDE 2 MEQ/ML IV SOLN
INTRAVENOUS | Status: DC
  Administered 2017-12-21 (×2): via INTRAVENOUS
  Filled 2017-12-21 (×3): qty 1000

## 2017-12-21 MED ORDER — DEXAMETHASONE SODIUM PHOSPHATE 10 MG/ML IJ SOLN
INTRAMUSCULAR | Status: DC | PRN
  Administered 2017-12-21: 10 mg via INTRAVENOUS

## 2017-12-21 MED ORDER — PILOCARPINE HCL 1 % OP SOLN
1.0000 [drp] | Freq: Three times a day (TID) | OPHTHALMIC | Status: DC
  Administered 2017-12-21 – 2017-12-22 (×4): 1 [drp] via OPHTHALMIC
  Filled 2017-12-21: qty 15

## 2017-12-21 MED ORDER — ROCURONIUM BROMIDE 10 MG/ML (PF) SYRINGE
PREFILLED_SYRINGE | INTRAVENOUS | Status: AC
  Filled 2017-12-21: qty 10

## 2017-12-21 MED ORDER — SUGAMMADEX SODIUM 200 MG/2ML IV SOLN
INTRAVENOUS | Status: DC | PRN
  Administered 2017-12-21: 200 mg via INTRAVENOUS

## 2017-12-21 MED ORDER — IRBESARTAN 150 MG PO TABS
150.0000 mg | ORAL_TABLET | Freq: Every day | ORAL | Status: DC
  Administered 2017-12-22: 150 mg via ORAL
  Filled 2017-12-21: qty 1

## 2017-12-21 MED ORDER — ROCURONIUM BROMIDE 50 MG/5ML IV SOSY
PREFILLED_SYRINGE | INTRAVENOUS | Status: DC | PRN
  Administered 2017-12-21: 40 mg via INTRAVENOUS

## 2017-12-21 MED ORDER — LIDOCAINE 2% (20 MG/ML) 5 ML SYRINGE
INTRAMUSCULAR | Status: AC
  Filled 2017-12-21: qty 5

## 2017-12-21 MED ORDER — ZOLPIDEM TARTRATE 5 MG PO TABS: 5.0000 mg | ORAL_TABLET | Freq: Every evening | ORAL | Status: DC | PRN

## 2017-12-21 MED ORDER — LIDOCAINE 2% (20 MG/ML) 5 ML SYRINGE
INTRAMUSCULAR | Status: DC | PRN
  Administered 2017-12-21: 40 mg via INTRAVENOUS

## 2017-12-21 MED ORDER — PHENYLEPHRINE HCL 10 MG/ML IJ SOLN: INTRAMUSCULAR | Status: DC | PRN

## 2017-12-21 MED ORDER — 0.9 % SODIUM CHLORIDE (POUR BTL) OPTIME
TOPICAL | Status: DC | PRN
  Administered 2017-12-21: 1000 mL

## 2017-12-21 MED ORDER — CYCLOBENZAPRINE HCL 5 MG PO TABS: 5.0000 mg | ORAL_TABLET | Freq: Every day | ORAL | Status: DC | PRN

## 2017-12-21 SURGICAL SUPPLY — 22 items
CATH ROBINSON RED A/P 18FR (CATHETERS) IMPLANT
COVER BACK TABLE 60X90IN (DRAPES) ×2 IMPLANT
COVER MAYO STAND STRL (DRAPES) ×2 IMPLANT
COVER SURGICAL LIGHT HANDLE (MISCELLANEOUS) ×2 IMPLANT
CUTTER FLEX LINEAR 45M (STAPLE) ×2 IMPLANT
DRAPE HALF SHEET 40X57 (DRAPES) ×2 IMPLANT
GAUZE SPONGE 4X4 12PLY STRL (GAUZE/BANDAGES/DRESSINGS) IMPLANT
GLOVE BIOGEL PI IND STRL 8 (GLOVE) IMPLANT
GLOVE BIOGEL PI INDICATOR 8 (GLOVE)
GLOVE ECLIPSE 7.5 STRL STRAW (GLOVE) ×2 IMPLANT
GUARD TEETH (MISCELLANEOUS) IMPLANT
KIT TURNOVER KIT B (KITS) ×2 IMPLANT
NS IRRIG 1000ML POUR BTL (IV SOLUTION) ×2 IMPLANT
PAD ARMBOARD 7.5X6 YLW CONV (MISCELLANEOUS) ×4 IMPLANT
RELOAD STAPLE 35X2.5 WHT THIN (STAPLE) IMPLANT
RELOAD STAPLE 45 3.5 BLU ETS (ENDOMECHANICALS) IMPLANT
RELOAD STAPLE TA45 3.5 REG BLU (ENDOMECHANICALS) ×2 IMPLANT
SOLUTION ANTI FOG 6CC (MISCELLANEOUS) ×2 IMPLANT
STAPLE RELOAD 2.5MM WHITE (STAPLE) IMPLANT
SURGILUBE 2OZ TUBE FLIPTOP (MISCELLANEOUS) ×2 IMPLANT
TOWEL OR 17X24 6PK STRL BLUE (TOWEL DISPOSABLE) ×2 IMPLANT
TUBE CONNECTING 12X1/4 (SUCTIONS) ×2 IMPLANT

## 2017-12-21 NOTE — Transfer of Care (Signed)
Immediate Anesthesia Transfer of Care Note  Patient: [name]  Procedure(s) Performed: Stanford Breed DIVERTICULECTOMY ENDOSCOPIC (N/A Throat)  Patient Location: PACU  Anesthesia Type:General  Level of Consciousness: awake, alert  and oriented  Airway & Oxygen Therapy: Patient Spontanous Breathing and Patient connected to face mask oxygen  Post-op Assessment: Report given to RN and Post -op Vital signs reviewed and stable  Post vital signs: Reviewed and stable  Last Vitals:  Vitals Value Taken Time  BP 126/54 12/21/2017  8:46 AM  Temp 36.1 C 12/21/2017  8:45 AM  Pulse 93 12/21/2017  8:47 AM  Resp 11 12/21/2017  8:47 AM  SpO2 97 % 12/21/2017  8:47 AM  Vitals shown include unvalidated device data.  Last Pain: There were no vitals filed for this visit.       Complications: No apparent anesthesia complications

## 2017-12-21 NOTE — Progress Notes (Signed)
   Subjective:    Patient ID: Laura Knapp, female    DOB: 1950/12/04, 67 y.o.   MRN: [id number]  HPI Doing well.  She can already tell her swallow is improved.  Review of Systems     Objective:   Physical Exam Alert, NAD No throat bleeding, normal voice.    Assessment & Plan:  Zenker's diverticulum s/p endoscopic diverticulotomy  Doing well.  Observe overnight.

## 2017-12-21 NOTE — Progress Notes (Addendum)
Patient complained of chest tightness when drinking her chicken broth. Set of VS taken and PRN Hycet administered and MD paged. Dr. Constance Holster stated to make patient NPO for now and he will re-evaluate her this afternoon.

## 2017-12-21 NOTE — Op Note (Signed)
OPERATIVE REPORT  DATE OF SURGERY: 12/21/2017  PATIENT:  Laura Knapp,  67 y.o. female  PRE-OPERATIVE DIAGNOSIS:  Diverticulum of Esophagus  POST-OPERATIVE DIAGNOSIS:  Diverticulum of Esophagus  PROCEDURE:  Procedure(s): ZENKER'S DIVERTICULECTOMY ENDOSCOPIC  SURGEON:  Beckie Salts, MD  ASSISTANTS: None  ANESTHESIA:   General   EBL: 10 ml  DRAINS: None  LOCAL MEDICATIONS USED:  None  SPECIMEN:  none  COUNTS:  Correct  PROCEDURE DETAILS: The patient was taken to the operating room and placed on the operating table in the supine position. Following induction of general endotracheal anesthesia, the table was turned 90 degrees and the patient was draped in a standard fashion.  A maxillary tooth protector was used throughout the case.  The ureter laryngoscope was entered into the oral cavity through the esophageal introitus and into the esophagus.  It was slowly withdrawn in order to position the posterior blade in the pouch and anterior blade in the esophagus.  The cricopharyngeal muscle was then easily visualized.  The endoscopic stapler was used in one pass to divide the muscle.  There appeared to possibly be a mucosal tear just posterior to this.  The scope was removed.  The patient was awakened extubated and transferred to recovery in stable condition.    PATIENT DISPOSITION:  To PACU, stable

## 2017-12-21 NOTE — Interval H&P Note (Signed)
History and Physical Interval Note:  12/21/2017 7:26 AM  Laura Knapp  has presented today for surgery, with the diagnosis of Diverticulum of Esophagus  The various methods of treatment have been discussed with the patient and family. After consideration of risks, benefits and other options for treatment, the patient has consented to  Procedure(s): ZENKER'S DIVERTICULECTOMY ENDOSCOPIC (N/A) as a surgical intervention .  The patient's history has been reviewed, patient examined, no change in status, stable for surgery.  I have reviewed the patient's chart and labs.  Questions were answered to the patient's satisfaction.     Izora Gala

## 2017-12-21 NOTE — Anesthesia Preprocedure Evaluation (Signed)
Anesthesia Evaluation  Patient identified by MRN, date of birth, ID band Patient awake    Reviewed: Allergy & Precautions, NPO status , Patient's Chart, lab work & pertinent test results  History of Anesthesia Complications Negative for: history of anesthetic complications  Airway Mallampati: I  TM Distance: >3 FB Neck ROM: Full    Dental  (+) Missing, Teeth Intact,    Pulmonary shortness of breath, former smoker,    breath sounds clear to auscultation       Cardiovascular hypertension, Pt. on medications + Peripheral Vascular Disease  + dysrhythmias Atrial Fibrillation + Valvular Problems/Murmurs AI  Rhythm:Regular  MS, nl ef, cardiac clearance    Neuro/Psych negative neurological ROS  negative psych ROS   GI/Hepatic Neg liver ROS, GERD  ,Zenker diverticulum   Endo/Other  negative endocrine ROS  Renal/GU      Musculoskeletal  (+) Arthritis ,   Abdominal   Peds  Hematology negative hematology ROS (+)   Anesthesia Other Findings Hearing aides   Reproductive/Obstetrics                             Anesthesia Physical Anesthesia Plan  ASA: II  Anesthesia Plan: General   Post-op Pain Management:    Induction: Intravenous and Rapid sequence  PONV Risk Score and Plan: 3 and Ondansetron and Dexamethasone  Airway Management Planned: Oral ETT  Additional Equipment: None  Intra-op Plan:   Post-operative Plan: Extubation in OR  Informed Consent: I have reviewed the patients History and Physical, chart, labs and discussed the procedure including the risks, benefits and alternatives for the proposed anesthesia with the patient or authorized representative who has indicated his/her understanding and acceptance.   Dental advisory given  Plan Discussed with: CRNA and Surgeon  Anesthesia Plan Comments:         Anesthesia Quick Evaluation

## 2017-12-21 NOTE — Progress Notes (Signed)
Patient ID: Laura Knapp, female   DOB: 12-30-1950, 67 y.o.   MRN: [id number]  When she arrived upstairs after the surgery, she tried drinking and was complaining of some tightness in her chest.  She feels as though she was trying to drink too quickly and stop drinking for a while but now is eating Jell-O and sipping on water and doing fine.  She has some pain in her throat but denies any further chest symptoms.  On exam, she looks very healthy, she is breathing clearly, her voice is normal.  There is no swelling, erythema or crepitance in her neck.  She is taking small bites of Jell-O without difficulty.  Recommend we continue going slowly with clear liquids and reevaluate in the evening.

## 2017-12-21 NOTE — Anesthesia Procedure Notes (Signed)
Procedure Name: Intubation Date/Time: 12/21/2017 7:43 AM Performed by: Genelle Bal, CRNA Pre-anesthesia Checklist: Patient identified, Emergency Drugs available, Suction available and Patient being monitored Patient Re-evaluated:Patient Re-evaluated prior to induction Oxygen Delivery Method: Circle system utilized Preoxygenation: Pre-oxygenation with 100% oxygen Induction Type: IV induction Ventilation: Mask ventilation without difficulty Laryngoscope Size: Miller and 2 Grade View: Grade I Tube type: Oral Tube size: 7.0 mm Number of attempts: 1 Airway Equipment and Method: Stylet Placement Confirmation: ETT inserted through vocal cords under direct vision,  positive ETCO2 and breath sounds checked- equal and bilateral Secured at: 21 cm Tube secured with: Tape Dental Injury: Teeth and Oropharynx as per pre-operative assessment

## 2017-12-21 NOTE — Progress Notes (Signed)
ANTICOAGULATION CONSULT NOTE - Initial Consult  Pharmacy Consult for warfarin Indication: atrial fibrillation and MVR  Allergies  Allergen Reactions  . Acetazolamide Rash    Rash on lower extremities coupled with sudden numbness in fingers and toes - appeared after two doses  . Amlodipine Other (See Comments)    Fatigue  . Codeine Itching  . Lisinopril Cough    Patient Measurements: Height: 5\' 4"  (162.6 cm) Weight: 112 lb (50.8 kg) IBW/kg (Calculated) : 54.7  Vital Signs: Temp: 97.4 F (36.3 C) (07/31 0944) Temp Source: Oral (07/31 0944) BP: 136/48 (07/31 0944) Pulse Rate: 48 (07/31 0944)  Labs: Recent Labs    12/21/17 0634  LABPROT 14.2  INR 1.11    Estimated Creatinine Clearance: 33.5 mL/min (A) (by C-G formula based on SCr of 1.29 mg/dL (H)).  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 Last dose 7/26; goal 2 - 3 per [facility]  Pta dose 5 mg MWF, 2.5 mg AOD - last INR 3 on 7/9  Goal of Therapy:  INR 2-3 Monitor platelets by anticoagulation protocol: Yes   Plan:  Warfarin 5 mg x 1 Daily INR CBC  Levester Fresh, PharmD, BCPS, BCCCP Clinical Pharmacist [phone]  Please check AMION for all [facility] numbers  12/21/2017 10:44 AM

## 2017-12-22 ENCOUNTER — Encounter (HOSPITAL_COMMUNITY): Payer: Self-pay | Admitting: Otolaryngology

## 2017-12-22 DIAGNOSIS — K225 Diverticulum of esophagus, acquired: Secondary | ICD-10-CM | POA: Diagnosis not present

## 2017-12-22 LAB — PROTIME-INR
INR: 1.17
Prothrombin Time: 14.8 seconds (ref 11.4–15.2)

## 2017-12-22 NOTE — Care Management Note (Signed)
Case Management Note  Patient Details  Name: Laura Knapp MRN: [id number] Date of Birth: May 02, 1951  Subjective/Objective:    Pt s/p endoscopic diverticulotomy.  She is from home with spouse.               Action/Plan: Pt discharging home with self care. Pt has PCP, insurance and transportation home.   Expected Discharge Date:  12/22/17               Expected Discharge Plan:  Home/Self Care  In-House Referral:     Discharge planning Services     Post Acute Care Choice:    Choice offered to:     DME Arranged:    DME Agency:     HH Arranged:    HH Agency:     Status of Service:  Completed, signed off  If discussed at [employer] of Stay Meetings, dates discussed:    Additional Comments:  Pollie Friar, RN 12/22/2017, 11:34 AM

## 2017-12-22 NOTE — Discharge Summary (Signed)
Physician Discharge Summary  Patient ID: ASMI FUGERE MRN: [id number] DOB/AGE: [birth year] 67 y.o.  Admit date: 12/21/2017 Discharge date: 12/22/2017  Admission Diagnoses:Zenker's diverticulum  Discharge Diagnoses:  Active Problems:   Zenker diverticulum   Discharged Condition: good  Hospital Course: no complications  Consults: none  Significant Diagnostic Studies: none  Treatments: surgery: endoscopic zenker's  Discharge Exam: Blood pressure 110/66, pulse (!) 57, temperature 97.9 F (36.6 C), temperature source Oral, resp. rate 16, height 5\' 4"  (1.626 m), weight 50.8 kg (112 lb), SpO2 97 %. PHYSICAL EXAM: No neck swelling, voice normal, swallowing liquids well.  Disposition: Discharge disposition: 01-Home or Self Care       Discharge Instructions    Diet - low sodium heart healthy   Complete by:  As directed    Increase activity slowly   Complete by:  As directed      Allergies as of 12/22/2017      Reactions   Acetazolamide Rash   Rash on lower extremities coupled with sudden numbness in fingers and toes - appeared after two doses   Amlodipine Other (See Comments)   Fatigue   Codeine Itching   Lisinopril Cough      Medication List    TAKE these medications   acetaminophen 325 MG tablet Commonly known as:  TYLENOL Take 325 mg by mouth daily as needed for moderate pain.   atorvastatin 40 MG tablet Commonly known as:  LIPITOR Take 40 mg by mouth every evening.   B-12 1000 MCG Caps Take 1,000 mcg by mouth every other day.   brimonidine 0.2 % ophthalmic solution Commonly known as:  ALPHAGAN Place 1 drop into both eyes 3 (three) times daily.   cyclobenzaprine 10 MG tablet Commonly known as:  FLEXERIL Take 5-10 mg by mouth daily as needed for muscle spasms.   dexlansoprazole 60 MG capsule Commonly known as:  DEXILANT Take 60 mg by mouth every evening.   diphenhydrAMINE 25 MG tablet Commonly known as:  BENADRYL Take 25 mg by mouth daily as  needed for allergies.   dorzolamide-timolol 22.3-6.8 MG/ML ophthalmic solution Commonly known as:  COSOPT Place 1 drop into both eyes 2 (two) times daily.   ENSURE Take 237 mLs by mouth every other day. Chocolate   latanoprost 0.005 % ophthalmic solution Commonly known as:  XALATAN Place 1 drop into both eyes at bedtime.   metoprolol succinate 50 MG 24 hr tablet Commonly known as:  TOPROL-XL Take 50 mg by mouth daily. Take with or immediately following a meal.   olmesartan-hydrochlorothiazide 20-12.5 MG tablet Commonly known as:  BENICAR HCT Take 1 tablet by mouth daily.   pilocarpine 1 % ophthalmic solution Commonly known as:  PILOCAR Place 1 drop into both eyes 3 (three) times daily.   pilocarpine 4 % ophthalmic solution Commonly known as:  PILOCAR Place 1 drop into both eyes 3 (three) times daily.   sotalol 160 MG tablet Commonly known as:  BETAPACE Take 320 mg by mouth 2 (two) times daily.   traMADol 50 MG tablet Commonly known as:  ULTRAM Take 50 mg by mouth daily as needed for moderate pain or severe pain.   warfarin 5 MG tablet Commonly known as:  COUMADIN Take 2.5-5 mg by mouth See admin instructions. Take 2.5 mg by mouth daily at 1700 on Sun, Tues, Thurs, and Sat Take 5 mg once daily 1700 on Mon, Wed, and Fri   ZETIA 10 MG tablet Generic drug:  ezetimibe Take 10 mg by mouth  every evening.   zolpidem 10 MG tablet Commonly known as:  AMBIEN Take 10 mg by mouth at bedtime as needed for sleep.        Signed: Izora Gala 12/22/2017, 9:04 AM

## 2017-12-22 NOTE — Care Management Obs Status (Signed)
[facility] NOTIFICATION   Patient Details  Name: GWENDALYN MCGONAGLE MRN: [id number] Date of Birth: 11-24-1950   Medicare Observation Status Notification Given:  Yes    Pollie Friar, RN 12/22/2017, 10:58 AM

## 2017-12-22 NOTE — Discharge Instructions (Signed)
Advance diet as tolerated

## 2017-12-22 NOTE — Anesthesia Postprocedure Evaluation (Signed)
Anesthesia Post Note  Patient: Laura Knapp  Procedure(s) Performed: [facility] DIVERTICULECTOMY ENDOSCOPIC (N/A Throat)     Patient location during evaluation: PACU Anesthesia Type: General Level of consciousness: awake and alert Pain management: pain level controlled Vital Signs Assessment: post-procedure vital signs reviewed and stable Respiratory status: spontaneous breathing, nonlabored ventilation, respiratory function stable and patient connected to nasal cannula oxygen Cardiovascular status: blood pressure returned to baseline and stable Postop Assessment: no apparent nausea or vomiting Anesthetic complications: no    Last Vitals:  Vitals:   12/22/17 0519 12/22/17 1047  BP: 110/66 (!) 108/47  Pulse: (!) 57 61  Resp: 16 18  Temp: 36.6 C 36.4 C  SpO2: 97% 100%    Last Pain:  Vitals:   12/22/17 1057  TempSrc:   PainSc: 0-No pain                 Maral Lampe

## 2018-02-17 IMAGING — RF DG ESOPHAGUS
7 series · 14 of 24 positions shown · non-contrast
Comparison: Barium swallow of 03/14/2013

CLINICAL DATA: History of Barnette diverticulum, some dysphagia,
feeling of food getting stuck in esophagus

EXAM:
ESOPHOGRAM / BARIUM SWALLOW / BARIUM TABLET STUDY
TECHNIQUE: Combined double contrast and single contrast examination performed
using effervescent crystals, thick barium liquid, and thin barium
liquid. The patient was observed with fluoroscopy swallowing a 13 mm
barium sulphate tablet.
FLUOROSCOPY TIME:  Fluoroscopy Time:  1 minute 42 seconds
Radiation Exposure Index (if provided by the fluoroscopic device):
73 mGy
Number of Acquired Spot Images: 0

[Series 1: sequence · 2 of 6 frames shown (1 of 6)]
[frame 1/6]
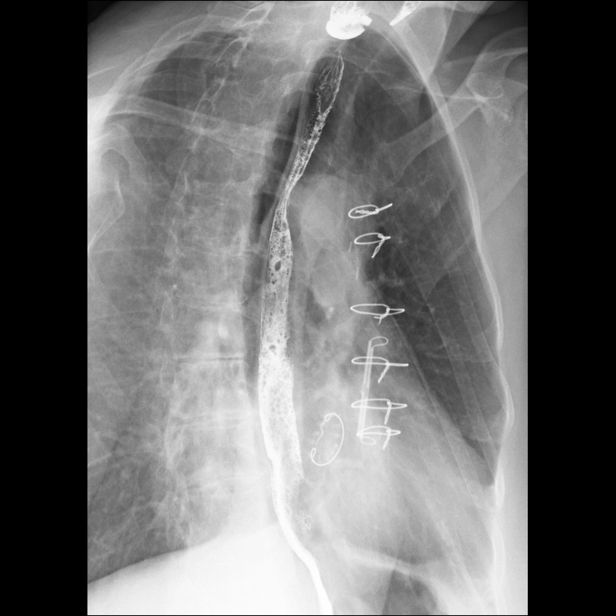
[frame 6/6]
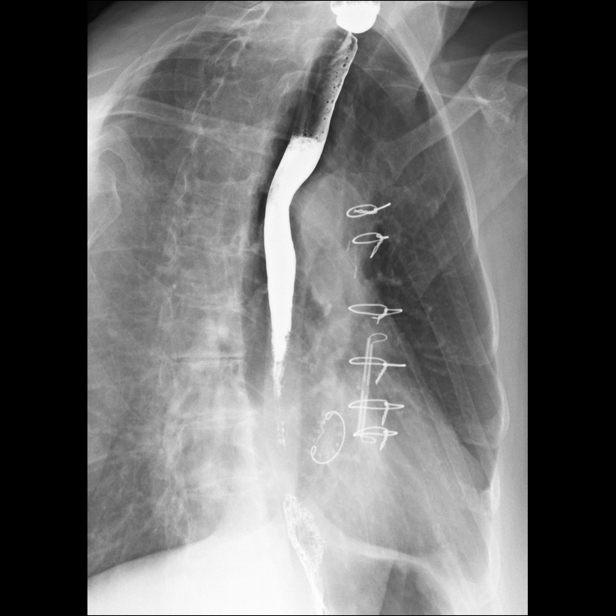

[Series 2: sequence · 1 of 6 frames shown (2 of 6)]
[frame 6/6]
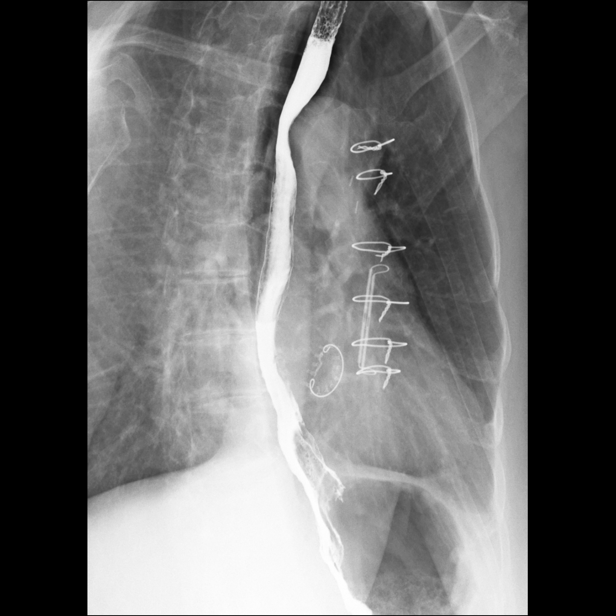

[Series 3: sequence · 2 of 6 frames shown (3 of 6)]
[frame 3/6]
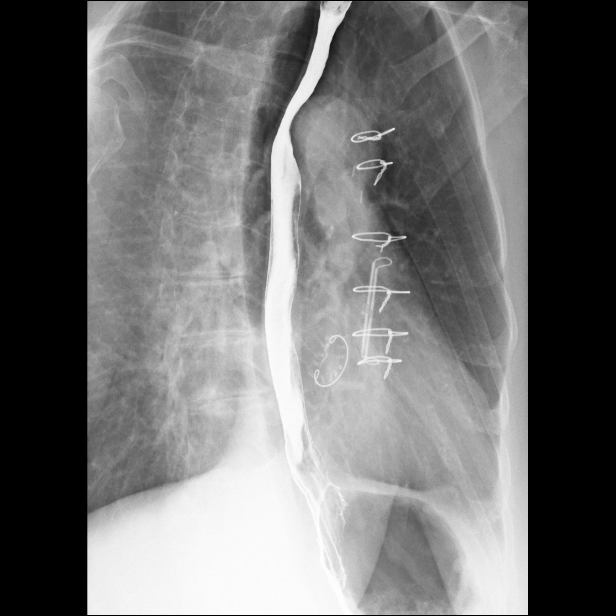
[frame 4/6]
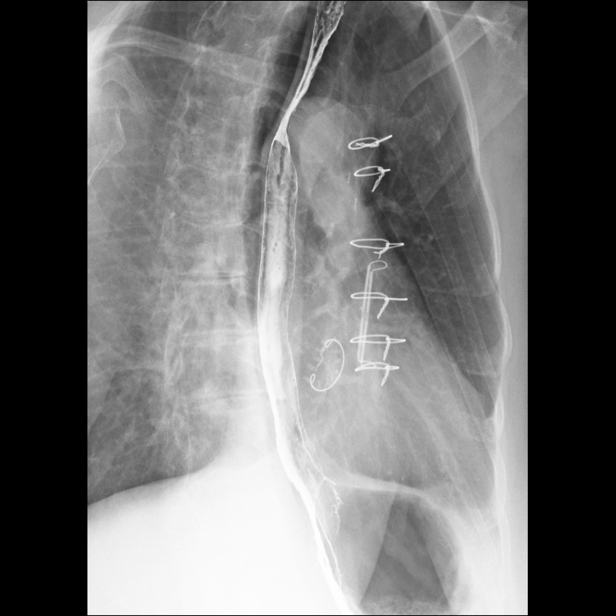

[Series 4: sequence · 2 of 34 frames shown (4 of 6)]
[frame 6/34]
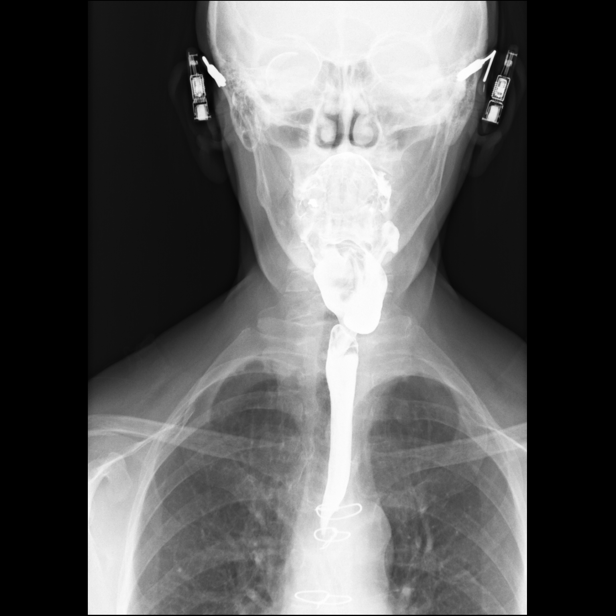
[frame 18/34]
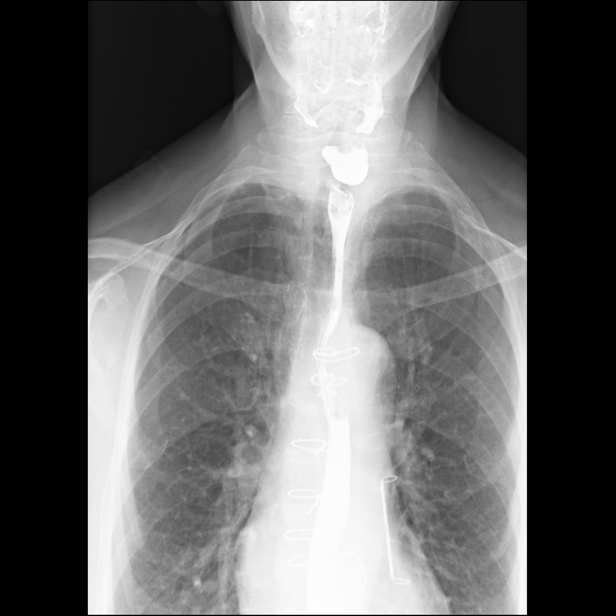

[Series 5: sequence · 2 of 18 frames shown (5 of 6)]
[frame 3/18]
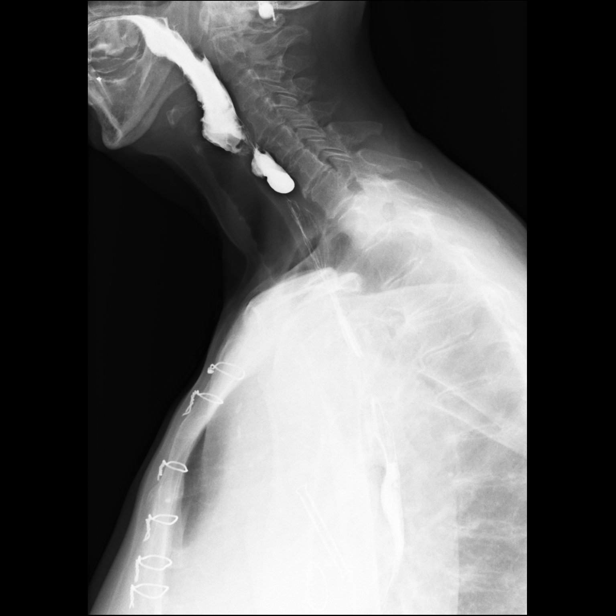
[frame 16/18]
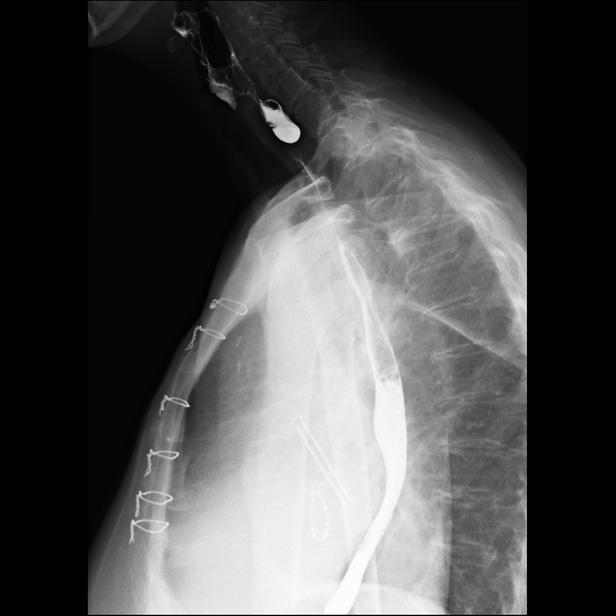

[Series 6: sequence · 3 of 43 frames shown (6 of 6)]
[frame 7/43]
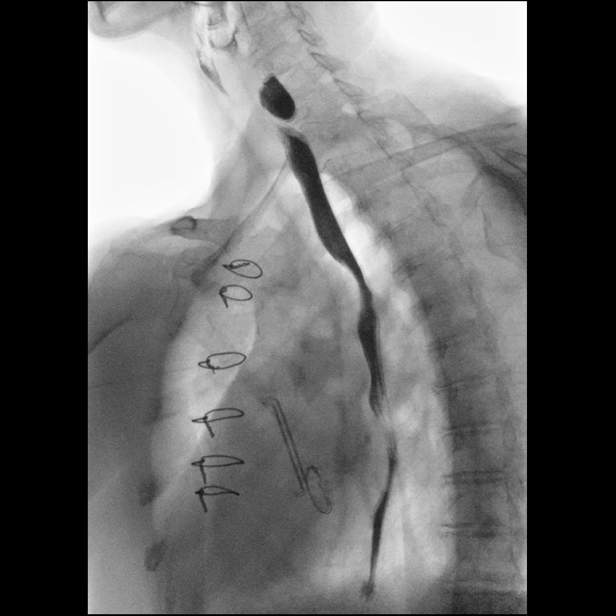
[frame 36/43]
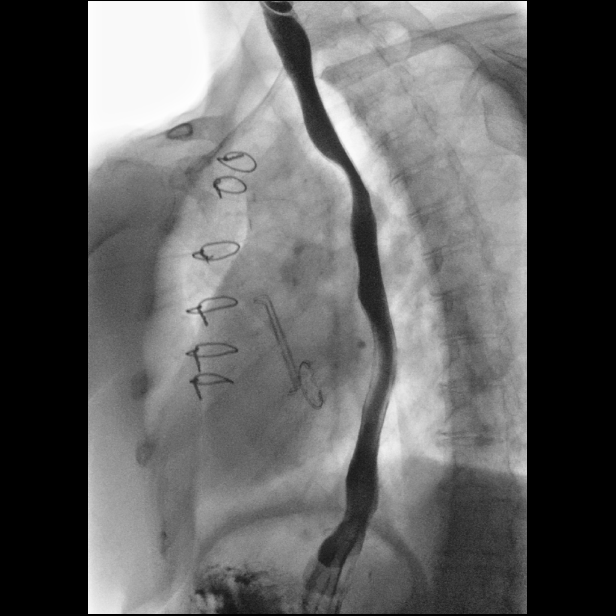
[frame 37/43]
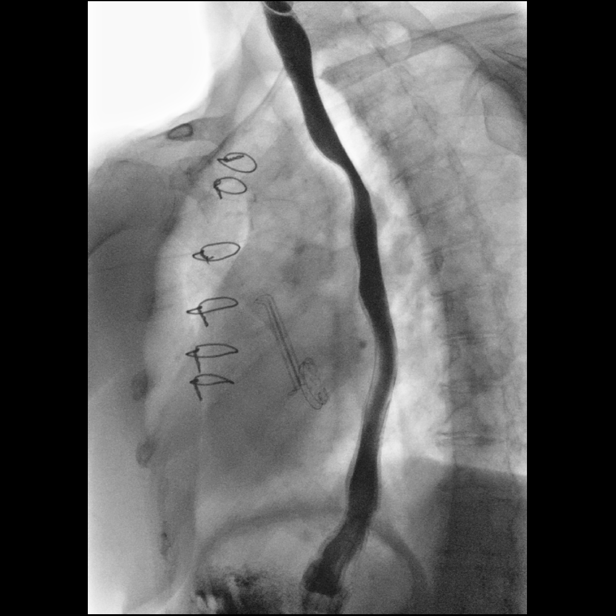

[Series 7: one shot · 2 of 5 slices shown]
[im 3/5]
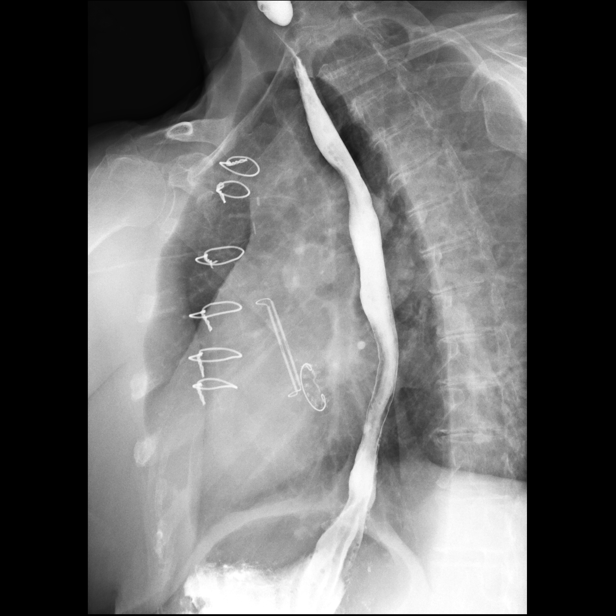
[im 5/5]
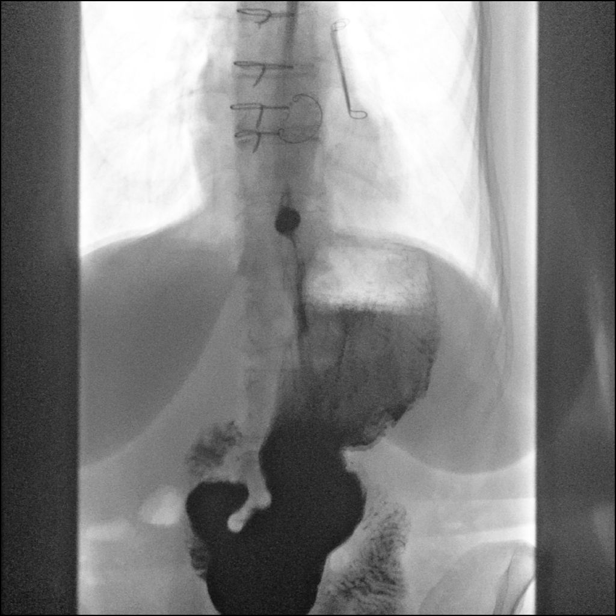

[14 of 24 positions shown; findings below may reference images not displayed]

FINDINGS: A double-contrast barium swallow was performed. The mucosa of the
esophagus appears unremarkable with multiple air bubbles present
that do change position on several images. Rapid sequence spot films
of the cervical esophagus were performed. Compared to the prior
barium swallow, the size of the Barnette diverticulum has increased
since the prior study of 4041, at least 3-4 times larger in size
with a wide mouth. The remainder of esophageal peristalsis is
normal. No hiatal hernia is seen, but the GE junction does appear to
be rather patulous. Significant gastroesophageal reflux is noted
with the water siphon maneuver at the end of the study. A barium
pill was given which did lodge several cm above the gastroesophageal
junction and did not pass after additional water and barium were
given. This finding indicates a short segment distal esophageal
stricture.
IMPRESSION: 1. The previously demonstrated Barnette diverticulum has increased
in size over the intervening 4 years. The diverticulum now is at
least 3-4 times larger than on the prior study with a wide mouth.
2. The GE junction is patulous and there is significant
gastroesophageal reflux. The barium pill lodges distally indicating
a distal esophageal stricture.

## 2018-07-06 ENCOUNTER — Other Ambulatory Visit: Payer: Self-pay | Admitting: Cardiology

## 2018-07-06 DIAGNOSIS — I6522 Occlusion and stenosis of left carotid artery: Secondary | ICD-10-CM

## 2018-07-26 ENCOUNTER — Other Ambulatory Visit: Payer: Self-pay | Admitting: Cardiology

## 2018-08-22 ENCOUNTER — Other Ambulatory Visit: Payer: Self-pay

## 2018-08-28 ENCOUNTER — Ambulatory Visit: Payer: Self-pay | Admitting: Cardiology

## 2018-09-04 ENCOUNTER — Other Ambulatory Visit: Payer: Self-pay | Admitting: Cardiology

## 2018-09-07 ENCOUNTER — Ambulatory Visit: Payer: Self-pay | Admitting: Cardiology

## 2018-09-09 ENCOUNTER — Telehealth: Payer: Self-pay | Admitting: Family Medicine

## 2018-09-09 NOTE — Telephone Encounter (Deleted)
**  [facility] After Hours/ Emergency Line Call**  Patient: Laura Knapp.  [place] from [facility] calling back.  There was a missed call regarding UA and antibiotics.  PCP: Aletha Halim., PA-C  Endorsing urinary urgency.  I informed [place] RN that it appears that patient's UA showed infection and that her PCP sent in Keflex 500mg  QID.  Red flags discussed.  Will forward to PCP.  Ashly M. Lajuana Ripple, DO

## 2018-09-09 NOTE — Telephone Encounter (Signed)
Erroneous entry

## 2018-09-21 ENCOUNTER — Other Ambulatory Visit: Payer: Self-pay

## 2018-09-29 ENCOUNTER — Ambulatory Visit: Payer: Self-pay | Admitting: Cardiology

## 2018-12-04 ENCOUNTER — Other Ambulatory Visit: Payer: Self-pay | Admitting: Cardiology

## 2018-12-04 NOTE — Telephone Encounter (Signed)
Please fill if necessary

## 2019-01-09 ENCOUNTER — Ambulatory Visit (INDEPENDENT_AMBULATORY_CARE_PROVIDER_SITE_OTHER): Payer: Medicare Other

## 2019-01-09 ENCOUNTER — Other Ambulatory Visit: Payer: Self-pay | Admitting: Cardiology

## 2019-01-09 ENCOUNTER — Other Ambulatory Visit: Payer: Self-pay

## 2019-01-09 DIAGNOSIS — I6522 Occlusion and stenosis of left carotid artery: Secondary | ICD-10-CM

## 2019-01-09 DIAGNOSIS — I6523 Occlusion and stenosis of bilateral carotid arteries: Secondary | ICD-10-CM

## 2019-01-18 ENCOUNTER — Other Ambulatory Visit: Payer: Self-pay | Admitting: Cardiology

## 2019-01-19 LAB — CBC WITH DIFFERENTIAL/PLATELET
Basophils Absolute: 0.1 10*3/uL (ref 0.0–0.2)
Basos: 1 %
EOS (ABSOLUTE): 0.1 10*3/uL (ref 0.0–0.4)
Eos: 2 %
Hematocrit: 39.4 % (ref 34.0–46.6)
Hemoglobin: 13.3 g/dL (ref 11.1–15.9)
Immature Grans (Abs): 0 10*3/uL (ref 0.0–0.1)
Immature Granulocytes: 0 %
Lymphocytes Absolute: 1 10*3/uL (ref 0.7–3.1)
Lymphs: 22 %
MCH: 28.4 pg (ref 26.6–33.0)
MCHC: 33.8 g/dL (ref 31.5–35.7)
MCV: 84 fL (ref 79–97)
Monocytes Absolute: 0.4 10*3/uL (ref 0.1–0.9)
Monocytes: 9 %
Neutrophils Absolute: 2.9 10*3/uL (ref 1.4–7.0)
Neutrophils: 66 %
Platelets: 225 10*3/uL (ref 150–450)
RBC: 4.68 x10E6/uL (ref 3.77–5.28)
RDW: 13.3 % (ref 11.7–15.4)
WBC: 4.5 10*3/uL (ref 3.4–10.8)

## 2019-01-19 LAB — BASIC METABOLIC PANEL
BUN/Creatinine Ratio: 12 (ref 12–28)
BUN: 12 mg/dL (ref 8–27)
CO2: 25 mmol/L (ref 20–29)
Calcium: 9.6 mg/dL (ref 8.7–10.3)
Chloride: 95 mmol/L — ABNORMAL LOW (ref 96–106)
Creatinine, Ser: 0.97 mg/dL (ref 0.57–1.00)
GFR calc Af Amer: 69 mL/min/{1.73_m2} (ref >59)
GFR calc non Af Amer: 60 mL/min/{1.73_m2} (ref >59)
Glucose: 88 mg/dL (ref 65–99)
Potassium: 4.7 mmol/L (ref 3.5–5.2)
Sodium: 137 mmol/L (ref 134–144)

## 2019-01-25 ENCOUNTER — Ambulatory Visit: Payer: Self-pay | Admitting: Cardiology

## 2019-01-26 NOTE — Progress Notes (Signed)
Pt was on another call so spouse made aware of results.//ah

## 2019-02-02 ENCOUNTER — Other Ambulatory Visit: Payer: Self-pay | Admitting: Cardiology

## 2019-02-17 ENCOUNTER — Other Ambulatory Visit: Payer: Self-pay | Admitting: Cardiology

## 2019-02-21 ENCOUNTER — Other Ambulatory Visit: Payer: Self-pay | Admitting: Cardiology

## 2019-02-23 ENCOUNTER — Other Ambulatory Visit: Payer: Self-pay

## 2019-02-23 MED ORDER — ATORVASTATIN CALCIUM 40 MG PO TABS: 40.0000 mg | ORAL_TABLET | Freq: Every day | ORAL | 0 refills | Status: DC

## 2019-02-26 ENCOUNTER — Encounter: Payer: Self-pay | Admitting: Cardiology

## 2019-02-26 ENCOUNTER — Other Ambulatory Visit: Payer: Self-pay

## 2019-02-26 ENCOUNTER — Ambulatory Visit (INDEPENDENT_AMBULATORY_CARE_PROVIDER_SITE_OTHER): Payer: Medicare Other | Admitting: Cardiology

## 2019-02-26 VITALS — BP 136/80 | Temp 94.2°F | Ht 64.0 in | Wt 110.6 lb

## 2019-02-26 DIAGNOSIS — E7800 Pure hypercholesterolemia, unspecified: Secondary | ICD-10-CM

## 2019-02-26 DIAGNOSIS — I10 Essential (primary) hypertension: Secondary | ICD-10-CM

## 2019-02-26 DIAGNOSIS — Z9889 Other specified postprocedural states: Secondary | ICD-10-CM

## 2019-02-26 DIAGNOSIS — I480 Paroxysmal atrial fibrillation: Secondary | ICD-10-CM | POA: Diagnosis not present

## 2019-02-26 DIAGNOSIS — I6523 Occlusion and stenosis of bilateral carotid arteries: Secondary | ICD-10-CM

## 2019-02-26 NOTE — Progress Notes (Signed)
Primary Physician/Referring:  Aletha Halim., PA-C  Patient ID: Laura Knapp, female    DOB: 08/15/1950, 68 y.o.   MRN: [id number]  Chief Complaint  Patient presents with  . Atrial Fibrillation  . Mitral Valve Prolapse    carotid, labs  . Results  . Follow-up   HPI:    Laura Knapp  is a 68 y.o. Caucasian female with MVP and S/P MV repair and paroxysmal A Fib maintains sinus on Sotalol presents for 6 month f/u. Essentially asymptomatic. Patient underwent complex mitral valve repair with neo-chordal reconstruction and LAA ligation on 11/26/2015 at [place], [place].  She has history of GI bleed in 2017 and since she has had polypectomy no further bleeding. She follows Dr. Carol Ada for the same. Tolerating Warfarin, being managed by her PCP. She is presently maintaining sinus rhythm on sotalol and low-dose of beta blocker. No leg edema. Overall doing well and essentially asymptomatic.  Past Medical History:  Diagnosis Date  . Arrhythmia    atrial fibrillation  . Arthritis    Back  . Atrial fibrillation ([place])   . Cancer ([place])    skin cancer nose- squamous  . 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 Dyspnea    with exertion  . GERD (gastroesophageal reflux disease)   . Glaucoma   . Heart murmur   . Hepatitis    as a child  . HOH (hard of hearing)   . Hypertension   . Pulmonary hypertension ([place])   . 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 ABDOMINAL HYSTERECTOMY     partial  . BREAST LUMPECTOMY     BOTH BREAST  . CARDIAC CATHETERIZATION N/A 10/16/2015   Procedure: Right/Left Heart Cath and Coronary Angiography;  Surgeon: Adrian Prows, MD;  Location: [facility];  Service: Cardiovascular;  Laterality: N/A;  . COLONOSCOPY    . EYE SURGERY Right    x 3 for Glaucoma  . MITRAL VALVE REPAIR  11/26/2015   [facility]    . PERIPHERAL VASCULAR CATHETERIZATION  10/16/2015   Procedure: Thoracic Aortogram;  Surgeon: Adrian Prows, MD;  Location: [facility];  Service: Cardiovascular;;  . SKIN CANCER EXCISION     nose  . TEE WITHOUT CARDIOVERSION N/A 09/09/2015   Procedure: TRANSESOPHAGEAL ECHOCARDIOGRAM (TEE);  Surgeon: Adrian Prows, MD;  Location: [facility];  Service: Cardiovascular;  Laterality: N/A;  . [place]  . ZENKER'S DIVERTICULECTOMY ENDOSCOPIC  12/21/2017  . ZENKER'S DIVERTICULECTOMY ENDOSCOPIC N/A 12/21/2017   Procedure: [id number] DIVERTICULECTOMY ENDOSCOPIC;  Surgeon: Izora Gala, MD;  Location: [facility];  Service: ENT;  Laterality: N/A;   Social History   Socioeconomic History  . Marital status: Married    Spouse name: Not on file  . Number of children: 3  . Years of education: Not on file  . Highest education level: Not on file  Occupational History  . Not on file  Social Needs  . Financial resource strain: Not on file  . Food insecurity    Worry: Not on file    Inability: Not on file  . Transportation needs    Medical: Not on file    Non-medical: Not on file  Tobacco Use  . Smoking status: Former Smoker    Packs/day: 0.25    Years: 20.00    Pack years: 5.00  Quit date: 27    Years since quitting: 30.7  . Smokeless tobacco: Former Systems developer    Quit date: 03/04/1990  . Tobacco comment: was a light smoker , husband smokes  Substance and Sexual Activity  . Alcohol use: No    Alcohol/week: 0.0 standard drinks  . Drug use: No  . Sexual activity: Not on file  Lifestyle  . Physical activity    Days per week: Not on file    Minutes per session: Not on file  . Stress: Not on file  Relationships  . Social Herbalist on phone: Not on file    Gets together: Not on file    Attends religious service: Not on file    Active member of club or organization: Not on file    Attends meetings of clubs or organizations: Not on file    Relationship status: Not on file  .  Intimate partner violence    Fear of current or ex partner: Not on file    Emotionally abused: Not on file    Physically abused: Not on file    Forced sexual activity: Not on file  Other Topics Concern  . Not on file  Social History Narrative  . Not on file   ROS  Review of Systems  Constitution: Negative for chills, decreased appetite, malaise/fatigue and weight gain.  Cardiovascular: Positive for dyspnea on exertion (stable). Negative for leg swelling and syncope.  Endocrine: Negative for cold intolerance.  Hematologic/Lymphatic: Does not bruise/bleed easily.  Musculoskeletal: Negative for joint swelling.  Gastrointestinal: Negative for abdominal pain, anorexia, change in bowel habit, hematochezia and melena.  Neurological: Negative for headaches and light-headedness.  Psychiatric/Behavioral: Negative for depression and substance abuse.  All other systems reviewed and are negative.  Objective   Vitals with BMI 02/26/2019 12/22/2017 12/22/2017  Height 5\' 4"  - -  Weight 110 lbs 10 oz - -  BMI 99991111 - -  Systolic [id number]  Diastolic 80 47 66  Pulse - 61 57    Blood pressure 136/80, temperature (!) 94.2 F (34.6 C), height 5\' 4"  (1.626 m), weight 110 lb 9.6 oz (50.2 kg). Body mass index is 18.98 kg/m.   Physical Exam  Constitutional:  She is petite, no acute distress.  HENT:  Head: Atraumatic.  Eyes: Conjunctivae are normal.  Neck: Neck supple. No JVD present. No thyromegaly present.  Cardiovascular: Normal rate, regular rhythm, S1 normal, S2 normal and intact distal pulses. Exam reveals no gallop.  Murmur heard.  Midsystolic murmur is present with a grade of 3/6 at the apex.  Middiastolic murmur is present with a grade of 2/6 at the apex. No leg edema, no JVD  Pulmonary/Chest: Effort normal and breath sounds normal.  Abdominal: Soft. Bowel sounds are normal.  Musculoskeletal: Normal range of motion.        General: No edema.  Neurological: She is alert.  Skin: Skin  is warm and dry.  Psychiatric: She has a normal mood and affect.   Radiology: No results found.  Laboratory examination:   Recent Labs    01/18/19 0812  NA 137  K 4.7  CL 95*  CO2 25  GLUCOSE 88  BUN 12  CREATININE 0.97  CALCIUM 9.6  GFRNONAA 60  GFRAA 69   CMP Latest Ref Rng & Units 01/18/2019 12/16/2017  Glucose 65 - 99 mg/dL 88 98  BUN 8 - 27 mg/dL 12 17  Creatinine 0.57 - 1.00 mg/dL 0.97 1.29(H)  Sodium  134 - 144 mmol/L 137 131(L)  Potassium 3.5 - 5.2 mmol/L 4.7 5.0  Chloride 96 - 106 mmol/L 95(L) 101  CO2 20 - 29 mmol/L 25 22  Calcium 8.7 - 10.3 mg/dL 9.6 8.9   CBC Latest Ref Rng & Units 01/18/2019 12/21/2017 12/16/2017  WBC 3.4 - 10.8 x10E3/uL 4.5 5.8 3.8(L)  Hemoglobin 11.1 - 15.9 g/dL 13.3 10.7(L) 11.6(L)  Hematocrit 34.0 - 46.6 % 39.4 32.1(L) 34.9(L)  Platelets 150 - 450 x10E3/uL 225 145(L) 233   Lipid Panel  No results found for: CHOL, TRIG, HDL, CHOLHDL, VLDL, LDLCALC, LDLDIRECT HEMOGLOBIN A1C No results found for: HGBA1C, MPG TSH No results for input(s): TSH in the last 8760 hours. Medications and allergies   Allergies  Allergen Reactions  . Acetazolamide Rash    Rash on lower extremities coupled with sudden numbness in fingers and toes - appeared after two doses  . Amlodipine Other (See Comments)    Fatigue  . Codeine Itching  . Lisinopril Cough     Prior to Admission medications   Medication Sig Start Date End Date Taking? Authorizing Provider  acetaminophen (TYLENOL) 325 MG tablet Take 325 mg by mouth daily as needed for moderate pain.     [provider]  atorvastatin (LIPITOR) 40 MG tablet Take 1 tablet (40 mg total) by mouth daily. 02/23/19   Adrian Prows, MD  brimonidine (ALPHAGAN) 0.2 % ophthalmic solution Place 1 drop into both eyes 3 (three) times daily.  07/11/15   [provider]  Cyanocobalamin (B-12) 1000 MCG CAPS Take 1,000 mcg by mouth every other day.    [provider]  cyclobenzaprine (FLEXERIL) 10 MG  tablet Take 5-10 mg by mouth daily as needed for muscle spasms.     [provider]  dexlansoprazole (DEXILANT) 60 MG capsule Take 60 mg by mouth every evening.     [provider]  diphenhydrAMINE (BENADRYL) 25 MG tablet Take 25 mg by mouth daily as needed for allergies.    [provider]  dorzolamide-timolol (COSOPT) 22.3-6.8 MG/ML ophthalmic solution Place 1 drop into both eyes 2 (two) times daily.    [provider]  ENSURE (ENSURE) Take 237 mLs by mouth every other day. Chocolate    [provider]  ezetimibe (ZETIA) 10 MG tablet TAKE 1 TABLET IN THE EVENING AFTER DINNER 02/21/19   Adrian Prows, MD  latanoprost (XALATAN) 0.005 % ophthalmic solution Place 1 drop into both eyes at bedtime.    [provider]  olmesartan-hydrochlorothiazide (BENICAR HCT) 20-12.5 MG tablet Take 1 tablet by mouth daily.    [provider]  pilocarpine (PILOCAR) 1 % ophthalmic solution Place 1 drop into both eyes 3 (three) times daily.    [provider]  pilocarpine (PILOCAR) 4 % ophthalmic solution Place 1 drop into both eyes 3 (three) times daily.     [provider]  sotalol (BETAPACE) 160 MG tablet TAKE 1 TABLET TWICE A DAY 12/04/18   Adrian Prows, MD  TOPROL XL 50 MG 24 hr tablet TAKE 1 TABLET DAILY 02/19/19   Adrian Prows, MD  traMADol (ULTRAM) 50 MG tablet Take 50 mg by mouth daily as needed for moderate pain or severe pain.     [provider]  warfarin (COUMADIN) 5 MG tablet TAKE ONE TABLET (5 MG) MONDAY, WEDNESDAY, AND FRIDAY AND ONE-HALF (1/2) TABLET (2.5 MG) ALL OTHER DAYS 07/26/18   Adrian Prows, MD  zolpidem (AMBIEN) 10 MG tablet Take 10 mg by mouth at bedtime  as needed for sleep.     [provider]     Current Outpatient Medications  Medication Instructions  . acetaminophen (TYLENOL) 325 mg, Oral, Daily PRN  . atorvastatin (LIPITOR) 40 mg, Oral, Daily  . brimonidine (ALPHAGAN) 0.2 % ophthalmic solution 1 drop,  Both Eyes, 3 times daily  . cyclobenzaprine (FLEXERIL) 5-10 mg, Oral, Daily PRN  . dexlansoprazole (DEXILANT) 60 mg, Oral, Every evening  . diphenhydrAMINE (BENADRYL) 25 mg, Oral, Daily PRN  . dorzolamide-timolol (COSOPT) 22.3-6.8 MG/ML ophthalmic solution 1 drop, Both Eyes, 2 times daily  . ENSURE (ENSURE) 237 mLs, Oral, Every other day, Chocolate   . ezetimibe (ZETIA) 10 MG tablet TAKE 1 TABLET IN THE EVENING AFTER DINNER  . fluticasone (FLONASE) 50 MCG/ACT nasal spray Each Nare, As needed  . latanoprost (XALATAN) 0.005 % ophthalmic solution 1 drop, Both Eyes, Daily at bedtime  . olmesartan-hydrochlorothiazide (BENICAR HCT) 20-12.5 MG tablet 1 tablet, Oral, Daily  . pilocarpine (PILOCAR) 4 % ophthalmic solution 1 drop, Both Eyes, 3 times daily  . sotalol (BETAPACE) 160 MG tablet TAKE 1 TABLET TWICE A DAY  . TOPROL XL 50 MG 24 hr tablet TAKE 1 TABLET DAILY  . traMADol (ULTRAM) 50 mg, Oral, Daily PRN  . warfarin (COUMADIN) 5 MG tablet TAKE ONE TABLET (5 MG) MONDAY, WEDNESDAY, AND FRIDAY AND ONE-HALF (1/2) TABLET (2.5 MG) ALL OTHER DAYS  . zolpidem (AMBIEN) 10 mg, Oral, At bedtime PRN    Cardiac Studies:   Nuclear stress test [09/22/2015]:  1. Resting EKG demonstrated normal sinus rhythm, normal axis. No evidence of ischemia. Stress EKG is positive for myocardial ischemia. With exercise there is frequent PVCs, ventricular couplets and ventricular complaints, associated with 2 mm horizontal ST segment depression in the inferior and lateral leads. ST segment changes persisted for greater than 3 minutes into recovery. Stress terminated due to fatigue. The patient performed treadmill exercise using a Bruce protocol, completing 6 minutes. The patient completed an estimated workload of 7.05 METS. 2. Myocardial perfusion imaging is normal. Overall left ventricular systolic function was normal without regional wall motion abnormalities. The left ventricular ejection fraction was 89%. This is an  intermediate risk study, clinical correlation recommended.This is an intermediate risk study due to arrhythmias and ST changes of ischemia. Multivessel disease and balanced ischemia cannot be excluded. Consider further cardiac work-up.  Coronary Angiogram [10/16/2015]: Normal coronary arteries, mild coronary calcification. 3 plus MR. Mild pulmonary hypertension with right heart suggestive of moderate to severe MR. Preserved CO/CI by [place].  MV repair in [place] 11/26/2015: Complex MV repair with neo-chordal reconstruction anterior and posterior leaflets with 24 CG ring and ligation of left atrial appendage with 45 Atriclip device.  Echocardiogram 03/16/2018: Left ventricle cavity is normal in size. Moderate concentric hypertrophy of the left ventricle. Normal global wall motion. Doppler evidence of grade I (impaired) diastolic dysfunction, normal LAP. Calculated EF 56%. Mild calcification of the aortic valve annulus and leaflets. Trace aortic stenosis. Aortic murmur likely due to moderate LVH and trace aortic stenosis. Inadequate tricuspid regurgitation jet to estimate pulmonary artery pressure. Normal right atrial pressure.  No evidence of significant pericardial effusion. Pericardial fat seen. Left ventricle cavity is normal in size. Normal global wall motion. Calculated EF 59%. H/o mitral valve repair with mild restriction of leaflets and mild mitral stenosis. MVA by PHT method 1.5 cm2, mean PG 6 mmHg at HR 50 bpm.  Inadequate tricuspid regurgitation jet to estimate pulmonary artery pressure. Normal right atrial pressure.  No significant change compared  to previous study in 2017.  Carotid artery duplex  01/09/2019: Stenosis in the right internal carotid artery (16-49%). Stenosis in the left internal carotid artery (50-69%). Antegrade right vertebral artery flow. Antegrade left vertebral artery flow. Compared to 02/10/2018, no significant change. Follow up in six months is appropriate if  clinically indicated.  Assessment     ICD-10-CM   1. Paroxysmal atrial fibrillation (HCC)  I48.0 EKG 12-Lead   CHA2DS2-VASc Score is 2.  Yearly risk of stroke: 2.3%.  (risk 3 if coronary calcification included)  2. S/P mitral valve repair  Z98.890    11/26/2015: Complex MV repair with neo-chordal reconstruction anterior and posterior leaflets with 24 CG ring and ligation of left atrial appendage with 45 Atricl  3. Essential hypertension  I10   4. Hypercholesteremia  E78.00   5. Asymptomatic carotid artery stenosis without infarction, bilateral  I65.23     EKG 10/0/2020: Normal sinus rhythm at rate of 62 bpm, normal axis, poor R-wave progression, cannot exclude anteroseptal infarct old.  No evidence of ischemia, normal QT interval. PACs. No significant change from    EKG 02/21/2018   Recommendations:   Laura Knapp is a Caucasian female with MVP and S/P MV repair and paroxysmal A Fib maintains sinus on Sotalol presents for 6 month f/u. Essentially asymptomatic. Patient underwent complex mitral valve repair with neo-chordal reconstruction and LAA ligation on 11/26/2015 at [place], [place].  Patient is here on a annual visit and follow-up of paroxysmal atrial fibrillation, mitral valve repair and essential hypertension and hyperlipidemia.  She is present and appropriate medical therapy, blood pressure is well controlled, she remains asymptomatic except for mild chronic dyspnea.  No change in her EKG.  I reviewed her labs, CBC and BMP is remained stable, continue sotalol for atrial fibrillation.  With regard to hyperlipidemia, she has an upcoming visit with complete physical examination with her PCP in December 2020 and she'll obtain these during that time.  I did not make any changes to her medications today.  With regard to carotid artery stenosis, stenosis appears to be moderate and stable will continue 6 monthly surveillance for now.  Adrian Prows, MD, [facility] 02/26/2019, 11:21 AM [facility] [place] Pager: [phone] Office: [phone] If no answer Cell [phone]

## 2019-03-14 ENCOUNTER — Other Ambulatory Visit: Payer: Self-pay

## 2019-03-14 MED ORDER — EZETIMIBE 10 MG PO TABS: ORAL_TABLET | ORAL | 3 refills | Status: DC

## 2019-07-08 ENCOUNTER — Ambulatory Visit: Payer: TRICARE For Life (TFL)

## 2019-07-09 ENCOUNTER — Other Ambulatory Visit: Payer: TRICARE For Life (TFL)

## 2019-07-09 ENCOUNTER — Other Ambulatory Visit: Payer: Self-pay | Admitting: Cardiology

## 2019-07-27 ENCOUNTER — Other Ambulatory Visit: Payer: Self-pay

## 2019-07-27 ENCOUNTER — Ambulatory Visit: Payer: TRICARE For Life (TFL)

## 2019-07-27 DIAGNOSIS — I6523 Occlusion and stenosis of bilateral carotid arteries: Secondary | ICD-10-CM

## 2019-10-01 ENCOUNTER — Other Ambulatory Visit: Payer: Self-pay | Admitting: Cardiology

## 2019-12-03 ENCOUNTER — Other Ambulatory Visit: Payer: Self-pay | Admitting: Cardiology

## 2019-12-03 NOTE — Telephone Encounter (Signed)
Refill request

## 2020-02-27 ENCOUNTER — Other Ambulatory Visit: Payer: Self-pay

## 2020-02-27 ENCOUNTER — Ambulatory Visit: Payer: Medicare Other | Admitting: Cardiology

## 2020-02-27 ENCOUNTER — Encounter: Payer: Self-pay | Admitting: Cardiology

## 2020-02-27 VITALS — BP 154/69 | HR 51 | Resp 16 | Ht 64.0 in | Wt 110.0 lb

## 2020-02-27 DIAGNOSIS — I480 Paroxysmal atrial fibrillation: Secondary | ICD-10-CM

## 2020-02-27 DIAGNOSIS — I10 Essential (primary) hypertension: Secondary | ICD-10-CM

## 2020-02-27 DIAGNOSIS — I6523 Occlusion and stenosis of bilateral carotid arteries: Secondary | ICD-10-CM

## 2020-02-27 DIAGNOSIS — I340 Nonrheumatic mitral (valve) insufficiency: Secondary | ICD-10-CM

## 2020-02-27 DIAGNOSIS — E7800 Pure hypercholesterolemia, unspecified: Secondary | ICD-10-CM

## 2020-02-27 DIAGNOSIS — Z9889 Other specified postprocedural states: Secondary | ICD-10-CM

## 2020-02-27 MED ORDER — HYDRALAZINE HCL 25 MG PO TABS: 25.0000 mg | ORAL_TABLET | Freq: Three times a day (TID) | ORAL | 1 refills | Status: DC

## 2020-02-27 NOTE — Progress Notes (Signed)
Primary Physician/Referring:  Aletha Halim., PA-C  Patient ID: Laura Knapp, female    DOB: 05-27-1950, 69 y.o.   MRN: [id number]  Chief Complaint  Patient presents with  . MV repair  . Carotid stenosis  . Follow-up    1 year    HPI:    Laura Knapp  is a 69 y.o. Caucasian female with MVP and S/P MV repair and paroxysmal A Fib maintains sinus on Sotalol presents for 6 month f/u. Essentially asymptomatic. Patient underwent complex mitral valve repair with neo-chordal reconstruction and LAA ligation on 11/26/2015 at [place], [facility].  She has history of GI bleed in 2017 and since she has had polypectomy no further bleeding. Tolerating Warfarin, being managed by her PCP. She is presently maintaining sinus rhythm on sotalol and low-dose of beta blocker. No leg edema. Overall doing well and essentially asymptomatic.  Recently told to have low sodium levels and since then has been eating more salty food.  Past Medical History:  Diagnosis Date  . Arrhythmia    atrial fibrillation  . Arthritis    Back  . Atrial fibrillation ([place])   . Cancer ([place])    skin cancer nose- squamous  . 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 Dyspnea    with exertion  . GERD (gastroesophageal reflux disease)   . Glaucoma   . Heart murmur   . Hepatitis    as a child  . HOH (hard of hearing)   . Hypertension   . Pulmonary hypertension ([place])   . 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 ABDOMINAL HYSTERECTOMY     partial  . BREAST LUMPECTOMY     BOTH BREAST  . CARDIAC CATHETERIZATION N/A 10/16/2015   Procedure: Right/Left Heart Cath and Coronary Angiography;  Surgeon: Adrian Prows, MD;  Location: [facility];  Service: Cardiovascular;  Laterality: N/A;  . COLONOSCOPY    . EYE SURGERY Right    x 3 for Glaucoma  . MITRAL VALVE REPAIR  11/26/2015   [facility]   . PERIPHERAL VASCULAR CATHETERIZATION  10/16/2015   Procedure: Thoracic Aortogram;  Surgeon: Adrian Prows, MD;  Location: [facility];  Service: Cardiovascular;;  . SKIN CANCER EXCISION     nose  . TEE WITHOUT CARDIOVERSION N/A 09/09/2015   Procedure: TRANSESOPHAGEAL ECHOCARDIOGRAM (TEE);  Surgeon: Adrian Prows, MD;  Location: [facility];  Service: Cardiovascular;  Laterality: N/A;  . [place]  . ZENKER'S DIVERTICULECTOMY ENDOSCOPIC  12/21/2017  . ZENKER'S DIVERTICULECTOMY ENDOSCOPIC N/A 12/21/2017   Procedure: [id number] DIVERTICULECTOMY ENDOSCOPIC;  Surgeon: Izora Gala, MD;  Location: [facility];  Service: ENT;  Laterality: N/A;   Social History   Tobacco Use  . Smoking status: Former Smoker    Packs/day: 0.25    Years: 20.00    Pack years: 5.00    Quit date: 1990    Years since quitting: 31.7  . Smokeless tobacco: Former Systems developer    Quit date: 03/04/1990  . Tobacco comment: was a light smoker , husband smokes  Substance Use Topics  . Alcohol use: No    Alcohol/week: 0.0 standard drinks   Marital Status: Married   ROS  Review of Systems  Cardiovascular: Negative for chest pain, dyspnea on exertion and leg swelling.  Gastrointestinal: Negative for melena.   Objective   Vitals with BMI 02/27/2020  02/26/2019 12/22/2017  Height _0  _1  -  Weight 110 lbs 110 lbs 10 oz -  BMI 88.41 66.06 -  Systolic 301 601 093  Diastolic 69 80 47  Pulse 51 - 61    Blood pressure (!) 154/69, pulse (!) 51, resp. rate 16, height _2  (1.626 m), weight 110 lb (49.9 kg), SpO2 99 %. Body mass index is 18.88 kg/m.   Physical Exam Constitutional:      Comments: She is petite, no acute distress.  HENT:     Head: Atraumatic.  Eyes:     Conjunctiva/sclera: Conjunctivae normal.  Neck:     Thyroid: No thyromegaly.     Vascular: No JVD.  Cardiovascular:     Rate and Rhythm: Normal rate and regular rhythm.     Pulses: Intact distal pulses.     Heart sounds: S1 normal and  S2 normal. Murmur heard.  Midsystolic murmur is present with a grade of 3/6 at the apex.  Middiastolic murmur is present with a grade of 2/4 at the apex.  No gallop.      Comments: No leg edema, no JVD Pulmonary:     Effort: Pulmonary effort is normal.     Breath sounds: Normal breath sounds.  Abdominal:     General: Bowel sounds are normal.     Palpations: Abdomen is soft.  Musculoskeletal:        General: Normal range of motion.     Cervical back: Neck supple.  Skin:    General: Skin is warm and dry.  Neurological:     Mental Status: She is alert.    Radiology:   No results found.  Laboratory examination:   No results for input(s): NA, K, CL, CO2, GLUCOSE, BUN, CREATININE, CALCIUM, GFRNONAA, GFRAA in the last 8760 hours. CMP Latest Ref Rng & Units 01/18/2019 12/16/2017  Glucose 65 - 99 mg/dL 88 98  BUN 8 - 27 mg/dL 12 17  Creatinine 0.57 - 1.00 mg/dL 0.97 1.29(H)  Sodium 134 - 144 mmol/L 137 131(L)  Potassium 3.5 - 5.2 mmol/L 4.7 5.0  Chloride 96 - 106 mmol/L 95(L) 101  CO2 20 - 29 mmol/L 25 22  Calcium 8.7 - 10.3 mg/dL 9.6 8.9   CBC Latest Ref Rng & Units 01/18/2019 12/21/2017 12/16/2017  WBC 3.4 - 10.8 x10E3/uL 4.5 5.8 3.8(L)  Hemoglobin 11.1 - 15.9 g/dL 13.3 10.7(L) 11.6(L)  Hematocrit 34.0 - 46.6 % 39.4 32.1(L) 34.9(L)  Platelets 150 - 450 x10E3/uL 225 145(L) 233   Lipid Panel  No results found for: CHOL, TRIG, HDL, CHOLHDL, VLDL, LDLCALC, LDLDIRECT HEMOGLOBIN A1C No results found for: HGBA1C, MPG TSH No results for input(s): TSH in the last 8760 hours.   External labs:  Lab 02/05/2020:   Sodium 132, potassium 4.8, serum glucose 83 mg, BUN 13, creatinine 1.05, EGFR 54 mL.  INR 2.80.  Hb 12.6/HCT 36.7, platelets 229.  Medications and allergies   Allergies  Allergen Reactions  . Acetazolamide Rash    Rash on lower extremities coupled with sudden numbness in fingers and toes - appeared after two doses  . Amlodipine Other (See Comments)    Fatigue  .  Codeine Itching  . Lisinopril Cough    Current Outpatient Medications on File Prior to Visit  Medication Sig Dispense Refill  . acetaminophen (TYLENOL) 325 MG tablet Take 325 mg by mouth daily as needed for moderate pain.     Marland Kitchen atorvastatin (LIPITOR) 40 MG tablet TAKE 1 TABLET DAILY 90  tablet 3  . brimonidine (ALPHAGAN) 0.2 % ophthalmic solution Place 1 drop into both eyes 3 (three) times daily.     . cyclobenzaprine (FLEXERIL) 10 MG tablet Take 5-10 mg by mouth daily as needed for muscle spasms.     Marland Kitchen dexlansoprazole (DEXILANT) 60 MG capsule Take 60 mg by mouth every evening.     . diphenhydrAMINE (BENADRYL) 25 MG tablet Take 25 mg by mouth daily as needed for allergies.    Marland Kitchen dorzolamide-timolol (COSOPT) 22.3-6.8 MG/ML ophthalmic solution Place 1 drop into both eyes 2 (two) times daily.    Marland Kitchen ENSURE (ENSURE) Take 237 mLs by mouth every other day. Chocolate    . ezetimibe (ZETIA) 10 MG tablet TAKE 1 TABLET IN THE EVENING AFTER DINNER 90 tablet 3  . latanoprost (XALATAN) 0.005 % ophthalmic solution Place 1 drop into both eyes at bedtime.    Marland Kitchen olmesartan-hydrochlorothiazide (BENICAR HCT) 20-12.5 MG tablet Take 1 tablet by mouth daily.    . pilocarpine (PILOCAR) 4 % ophthalmic solution Place 1 drop into both eyes 3 (three) times daily.     . sotalol (BETAPACE) 160 MG tablet TAKE 1 TABLET TWICE A DAY 180 tablet 3  . TOPROL XL 50 MG 24 hr tablet TAKE 1 TABLET DAILY 90 tablet 3  . traMADol (ULTRAM) 50 MG tablet Take 50 mg by mouth daily as needed for moderate pain or severe pain.     Marland Kitchen warfarin (COUMADIN) 5 MG tablet TAKE 1 TABLET ON MONDAY, WEDNESDAY, AND FRIDAY. TAKE ONE-HALF (1/2) TABLET (2.5 MG) ALL OTHER DAYS 90 tablet 3  . zolpidem (AMBIEN) 10 MG tablet Take 10 mg by mouth at bedtime as needed for sleep.      No current facility-administered medications on file prior to visit.     Cardiac Studies:   Nuclear stress test [09/22/2015]:  1. Resting EKG demonstrated normal sinus rhythm,  normal axis. No evidence of ischemia. Stress EKG is positive for myocardial ischemia. With exercise there is frequent PVCs, ventricular couplets and ventricular complaints, associated with 2 mm horizontal ST segment depression in the inferior and lateral leads. ST segment changes persisted for greater than 3 minutes into recovery. Stress terminated due to fatigue. The patient performed treadmill exercise using a Bruce protocol, completing 6 minutes. The patient completed an estimated workload of 7.05 METS. 2. Myocardial perfusion imaging is normal. Overall left ventricular systolic function was normal without regional wall motion abnormalities. The left ventricular ejection fraction was 89%. This is an intermediate risk study, clinical correlation recommended.This is an intermediate risk study due to arrhythmias and ST changes of ischemia. Multivessel disease and balanced ischemia cannot be excluded. Consider further cardiac work-up.  Coronary Angiogram [10/16/2015]: Normal coronary arteries, mild coronary calcification. 3 plus MR. Mild pulmonary hypertension with right heart suggestive of moderate to severe MR. Preserved CO/CI by Fick.  MV repair in [place] 11/26/2015: Complex MV repair with neo-chordal reconstruction anterior and posterior leaflets with 24 CG ring and ligation of left atrial appendage with 45 Atriclip device.  Echocardiogram 03/16/2018: Left ventricle cavity is normal in size. Moderate concentric hypertrophy of the left ventricle. Normal global wall motion. Doppler evidence of grade I (impaired) diastolic dysfunction, normal LAP. Calculated EF 56%. Mild calcification of the aortic valve annulus and leaflets. Trace aortic stenosis. Aortic murmur likely due to moderate LVH and trace aortic stenosis. Inadequate tricuspid regurgitation jet to estimate pulmonary artery pressure. Normal right atrial pressure.  No evidence of significant pericardial effusion. Pericardial fat seen. Left  ventricle  cavity is normal in size. Normal global wall motion. Calculated EF 59%. H/o mitral valve repair with mild restriction of leaflets and mild mitral stenosis. MVA by PHT method 1.5 cm2, mean PG 6 mmHg at HR 50 bpm.  Inadequate tricuspid regurgitation jet to estimate pulmonary artery pressure. Normal right atrial pressure.  No significant change compared to previous study in 2017.  Carotid artery duplex  07/27/2019: Duplex suggests stenosis in the right internal carotid artery (50-69%). Duplex suggests stenosis in the left internal carotid artery (50-69%). Antegrade right vertebral artery flow. Antegrade left vertebral artery flow. Follow up in six months is appropriate if clinically indicated. compared to 01/09/2019, there is mild progression of right ICA stenosis from l >50%.  EKG:   EKG 02/27/2020: Normal sinus rhythm with sinus arrhythmia at the rate of 57 bpm, left atrial enlargement, normal axis.  Single PAC.  No significant change from 10 08/2018.  Assessment     ICD-10-CM   1. Paroxysmal atrial fibrillation (HCC)  I48.0   2. Essential hypertension  I10 hydrALAZINE (APRESOLINE) 25 MG tablet    Basic metabolic panel  3. S/P mitral valve repair 11/26/2015  Z98.890 EKG 12-Lead    PCV ECHOCARDIOGRAM COMPLETE  4. Hypercholesteremia  E78.00 Lipid Panel With LDL/HDL Ratio  5. Nonrheumatic mitral valve regurgitation  I34.0 PCV ECHOCARDIOGRAM COMPLETE  6. Asymptomatic carotid artery stenosis without infarction, bilateral  I65.23    Meds ordered this encounter  Medications  . hydrALAZINE (APRESOLINE) 25 MG tablet    Sig: Take 1 tablet (25 mg total) by mouth 3 (three) times daily.    Dispense:  270 tablet    Refill:  1   Medications Discontinued During This Encounter  Medication Reason  . fluticasone (FLONASE) 50 MCG/ACT nasal spray Patient Preference    Recommendations:   Ms. Laura Knapp is a 69 y.o. Caucasian female with MVP and S/P MV repair and paroxysmal A Fib  maintains sinus on Sotalol presents for 6 month f/u. Essentially asymptomatic. Patient underwent complex mitral valve repair with neo-chordal reconstruction and LAA ligation on 11/26/2015 at [place], [place].  Patient presents here for annual visit, although not having any dyspnea or PND or orthopnea, she clearly has pansystolic murmur in the mitral area and suspect at least a moderate mitral regurgitation.  We will obtain an echocardiogram.  Her external labs reviewed, although she had low sodium it was down by one-point.  Patient has been eating excessive amounts of sodium to bring up her sodium levels.  Her blood pressure is uncontrolled, advised her to reduce her sodium intake.  I will add hydralazine 25 mg p.o. 3 times daily.  I will check BMP in 2 weeks, if hyponatremia still persist, will discontinue HCT part of olmesartan.  Reviewed her carotid artery duplex, carotid duplex essentially stable with moderate disease bilaterally and will need continued surveillance.  Lipids have not been checked, will obtain lipid profile testing as well in 2 to 3 weeks.  I will see her back in 4 to 6 weeks for follow-up.  She is tolerating anticoagulation for paroxysmal atrial fibrillation and is maintaining sinus rhythm on sotalol and also combination of metoprolol and remains asymptomatic, continue the same.  Anticoagulation being managed by PCP.   Adrian Prows, MD, [facility] 02/27/2020, 11:37 AM Office: [phone]

## 2020-02-28 ENCOUNTER — Ambulatory Visit: Payer: Medicare Other

## 2020-02-28 DIAGNOSIS — Z9889 Other specified postprocedural states: Secondary | ICD-10-CM

## 2020-02-28 DIAGNOSIS — I340 Nonrheumatic mitral (valve) insufficiency: Secondary | ICD-10-CM

## 2020-03-03 ENCOUNTER — Telehealth: Payer: Self-pay

## 2020-03-03 NOTE — Telephone Encounter (Signed)
Patient recently started on Hydralazine. She has not started it yet, but wants to know if she should also continue Olmesartan 40mg  when she starts the new medication? Please advise.

## 2020-03-05 NOTE — Telephone Encounter (Signed)
Patient is aware. She will take them both.

## 2020-03-05 NOTE — Telephone Encounter (Signed)
Yes please. Continue both

## 2020-03-06 NOTE — Progress Notes (Signed)
Called and spoke with patient regarding her echocardiogram results.

## 2020-03-19 LAB — BASIC METABOLIC PANEL
BUN/Creatinine Ratio: 11 — ABNORMAL LOW (ref 12–28)
BUN: 12 mg/dL (ref 8–27)
CO2: 22 mmol/L (ref 20–29)
Calcium: 9.6 mg/dL (ref 8.7–10.3)
Chloride: 93 mmol/L — ABNORMAL LOW (ref 96–106)
Creatinine, Ser: 1.09 mg/dL — ABNORMAL HIGH (ref 0.57–1.00)
GFR calc Af Amer: 59 mL/min/{1.73_m2} — ABNORMAL LOW (ref >59)
GFR calc non Af Amer: 52 mL/min/{1.73_m2} — ABNORMAL LOW (ref >59)
Glucose: 90 mg/dL (ref 65–99)
Potassium: 4.8 mmol/L (ref 3.5–5.2)
Sodium: 130 mmol/L — ABNORMAL LOW (ref 134–144)

## 2020-03-19 LAB — LIPID PANEL WITH LDL/HDL RATIO
Cholesterol, Total: 122 mg/dL (ref 100–199)
HDL: 59 mg/dL (ref >39)
LDL Chol Calc (NIH): 50 mg/dL (ref 0–99)
LDL/HDL Ratio: 0.8 ratio (ref 0.0–3.2)
Triglycerides: 57 mg/dL (ref 0–149)
VLDL Cholesterol Cal: 13 mg/dL (ref 5–40)

## 2020-04-10 ENCOUNTER — Ambulatory Visit: Payer: TRICARE For Life (TFL) | Admitting: Cardiology

## 2020-04-22 ENCOUNTER — Other Ambulatory Visit: Payer: Self-pay

## 2020-04-22 ENCOUNTER — Encounter: Payer: Self-pay | Admitting: Cardiology

## 2020-04-22 ENCOUNTER — Ambulatory Visit: Payer: Medicare Other | Admitting: Cardiology

## 2020-04-22 VITALS — BP 129/74 | HR 93 | Ht 64.0 in | Wt 109.0 lb

## 2020-04-22 DIAGNOSIS — I10 Essential (primary) hypertension: Secondary | ICD-10-CM

## 2020-04-22 DIAGNOSIS — E7800 Pure hypercholesterolemia, unspecified: Secondary | ICD-10-CM

## 2020-04-22 DIAGNOSIS — Z9889 Other specified postprocedural states: Secondary | ICD-10-CM

## 2020-04-22 DIAGNOSIS — I6523 Occlusion and stenosis of bilateral carotid arteries: Secondary | ICD-10-CM

## 2020-04-22 NOTE — Addendum Note (Signed)
Addended by: Kela Millin on: 04/22/2020 02:01 PM   Modules accepted: Orders

## 2020-04-22 NOTE — Progress Notes (Addendum)
Primary Physician/Referring:  Aletha Halim., PA-C  Patient ID: Laura Knapp, female    DOB: [birth year], 69 y.o.   MRN: [id number]  Chief Complaint  Patient presents with  . Atrial Fibrillation    test results  . Follow-up  . Hypertension   HPI:    Laura Knapp  is a 69 y.o. Caucasian female with MVP and S/P MV repair and paroxysmal A Fib maintains sinus on Sotalol presents for 6 month f/u. Essentially asymptomatic. Patient underwent complex mitral valve repair with neo-chordal reconstruction and LAA ligation on 11/26/2015 at [place], [place].  She has history of GI bleed in 2017 and since she has had polypectomy no further bleeding. Tolerating Warfarin, being managed by her PCP. She is presently maintaining sinus rhythm on sotalol and low-dose of beta blocker. No leg edema. Overall doing well and essentially asymptomatic.  Recently told to have low sodium levels and since then has been eating more salty food.  Patient presents for 6 week follow up of hypertension and for echocardiogram and lab results. At last visit patient started hydralazine 25 mg 3 times daily.   Past Medical History:  Diagnosis Date  . Arrhythmia    atrial fibrillation  . Arthritis    Back  . Atrial fibrillation ([place])   . Cancer ([place])    skin cancer nose- squamous  . 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 Dyspnea    with exertion  . GERD (gastroesophageal reflux disease)   . Glaucoma   . Heart murmur   . Hepatitis    as a child  . HOH (hard of hearing)   . Hypertension   . Pulmonary hypertension ([place])   . 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 ABDOMINAL HYSTERECTOMY     partial  . BREAST LUMPECTOMY     BOTH BREAST  . CARDIAC CATHETERIZATION N/A 10/16/2015   Procedure: Right/Left Heart Cath and Coronary Angiography;  Surgeon: Adrian Prows, MD;  Location: [facility];  Service: Cardiovascular;  Laterality: N/A;  . COLONOSCOPY    . EYE SURGERY Right    x 3 for Glaucoma  . MITRAL VALVE REPAIR  11/26/2015   [facility]   . PERIPHERAL VASCULAR CATHETERIZATION  10/16/2015   Procedure: Thoracic Aortogram;  Surgeon: Adrian Prows, MD;  Location: [facility];  Service: Cardiovascular;;  . SKIN CANCER EXCISION     nose  . TEE WITHOUT CARDIOVERSION N/A 09/09/2015   Procedure: TRANSESOPHAGEAL ECHOCARDIOGRAM (TEE);  Surgeon: Adrian Prows, MD;  Location: [employer] [facility];  Service: Cardiovascular;  Laterality: N/A;  . [place]  . ZENKER'S DIVERTICULECTOMY ENDOSCOPIC  12/21/2017  . ZENKER'S DIVERTICULECTOMY ENDOSCOPIC N/A 12/21/2017   Procedure: BPZWCH'E DIVERTICULECTOMY ENDOSCOPIC;  Surgeon: Izora Gala, MD;  Location: [place];  Service: ENT;  Laterality: N/A;   Social History   Tobacco Use  . Smoking status: Former Smoker    Packs/day: 0.25    Years: 20.00    Pack years: 5.00    Quit date: 1990    Years since quitting: 31.9  . Smokeless tobacco: Former Systems developer    Quit date: 03/04/1990  . Tobacco comment: was a light smoker , husband smokes  Substance Use Topics  . Alcohol use: No    Alcohol/week: 0.0 standard drinks   Marital Status: Married   ROS  Review of Systems  Cardiovascular: Negative for chest pain, dyspnea on exertion and leg swelling.  Gastrointestinal: Negative for melena.   Objective   Vitals with BMI 04/22/2020 02/27/2020 02/26/2019  Height _0  _1  _2   Weight 109 lbs 110 lbs 110 lbs 10 oz  BMI 18.7 97.67 34.19  Systolic 379 024 097  Diastolic 74 69 80  Pulse 93 51 -    Blood pressure 129/74, pulse 93, height _3  (1.626 m), weight 109 lb (49.4 kg), SpO2 100 %. Body mass index is 18.71 kg/m.   Physical Exam Constitutional:      Comments: She is petite, no acute distress.  HENT:     Head: Atraumatic.  Eyes:     Conjunctiva/sclera: Conjunctivae normal.  Neck:     Thyroid: No  thyromegaly.     Vascular: No JVD.  Cardiovascular:     Rate and Rhythm: Normal rate and regular rhythm.     Pulses: Intact distal pulses.     Heart sounds: S1 normal and S2 normal. Murmur heard.  Midsystolic murmur is present with a grade of 3/6 at the apex.  Middiastolic murmur is present with a grade of 2/4 at the apex.  No gallop.      Comments: No leg edema, no JVD Pulmonary:     Effort: Pulmonary effort is normal.     Breath sounds: Normal breath sounds.  Abdominal:     General: Bowel sounds are normal.     Palpations: Abdomen is soft.  Musculoskeletal:        General: Normal range of motion.     Cervical back: Neck supple.  Skin:    General: Skin is warm and dry.  Neurological:     Mental Status: She is alert.    Radiology:   No results found.  Laboratory examination:   Recent Labs    03/18/20 0934  NA 130*  K 4.8  CL 93*  CO2 22  GLUCOSE 90  BUN 12  CREATININE 1.09*  CALCIUM 9.6  GFRNONAA 52*  GFRAA 59*   CMP Latest Ref Rng & Units 03/18/2020 01/18/2019 12/16/2017  Glucose 65 - 99 mg/dL 90 88 98  BUN 8 - 27 mg/dL _4 Creatinine 0.57 - 1.00 mg/dL 1.09(H) 0.97 1.29(H)  Sodium 134 - 144 mmol/L 130(L) 137 131(L)  Potassium 3.5 - 5.2 mmol/L 4.8 4.7 5.0  Chloride 96 - 106 mmol/L 93(L) 95(L) 101  CO2 20 - 29 mmol/L _5 Calcium 8.7 - 10.3 mg/dL 9.6 9.6 8.9   CBC Latest Ref Rng & Units 01/18/2019 12/21/2017 12/16/2017  WBC 3.4 - 10.8 x10E3/uL 4.5 5.8 3.8(L)  Hemoglobin 11.1 - 15.9 g/dL 13.3 10.7(L) 11.6(L)  Hematocrit 34.0 - 46.6 % 39.4 32.1(L) 34.9(L)  Platelets 150 - 450 x10E3/uL 225 145(L) 233   Lipid Panel     Component Value Date/Time   CHOL 122 03/18/2020 0934   TRIG 57 03/18/2020 0934   HDL 59 03/18/2020 0934   LDLCALC 50 03/18/2020 0934   HEMOGLOBIN A1C No results found for: HGBA1C, MPG TSH No results for input(s): TSH in the last 8760 hours.   External labs:  Lab 02/05/2020:   Sodium 132, potassium 4.8, serum glucose 83 mg,  BUN 13, creatinine 1.05, EGFR 54 mL.  INR 2.80.  Hb 12.6/HCT 36.7, platelets 229.  Medications and allergies   Allergies  Allergen Reactions  . Acetazolamide Rash    Rash on lower extremities coupled with sudden numbness in fingers and toes -  appeared after two doses  . Amlodipine Other (See Comments)    Fatigue  . Codeine Itching  . Lisinopril Cough    Current Outpatient Medications on File Prior to Visit  Medication Sig Dispense Refill  . acetaminophen (TYLENOL) 325 MG tablet Take 325 mg by mouth daily as needed for moderate pain.     Marland Kitchen atorvastatin (LIPITOR) 40 MG tablet TAKE 1 TABLET DAILY 90 tablet 3  . brimonidine (ALPHAGAN) 0.2 % ophthalmic solution Place 1 drop into both eyes 3 (three) times daily.     . cetirizine (ZYRTEC) 10 MG tablet Take one pill per day    . cyclobenzaprine (FLEXERIL) 10 MG tablet Take 5-10 mg by mouth daily as needed for muscle spasms.     Marland Kitchen dexlansoprazole (DEXILANT) 60 MG capsule Take 60 mg by mouth every evening.     . diphenhydrAMINE (BENADRYL) 25 MG tablet Take 25 mg by mouth daily as needed for allergies.    Marland Kitchen dorzolamide-timolol (COSOPT) 22.3-6.8 MG/ML ophthalmic solution Place 1 drop into both eyes 2 (two) times daily.    Marland Kitchen ENSURE (ENSURE) Take 237 mLs by mouth every other day. Chocolate    . ezetimibe (ZETIA) 10 MG tablet TAKE 1 TABLET IN THE EVENING AFTER DINNER 90 tablet 3  . furosemide (LASIX) 20 MG tablet Take 10 mg by mouth daily.     . hydrALAZINE (APRESOLINE) 25 MG tablet Take 1 tablet (25 mg total) by mouth 3 (three) times daily. 270 tablet 1  . latanoprost (XALATAN) 0.005 % ophthalmic solution Place 1 drop into both eyes at bedtime.    Marland Kitchen olmesartan-hydrochlorothiazide (BENICAR HCT) 20-12.5 MG tablet Take 1 tablet by mouth daily.    . pilocarpine (PILOCAR) 4 % ophthalmic solution Place 1 drop into both eyes 3 (three) times daily.     Marland Kitchen PROAIR HFA 108 (90 Base) MCG/ACT inhaler Inhale into the lungs.    . sotalol (BETAPACE) 160 MG  tablet TAKE 1 TABLET TWICE A DAY 180 tablet 3  . TOPROL XL 50 MG 24 hr tablet TAKE 1 TABLET DAILY 90 tablet 3  . traMADol (ULTRAM) 50 MG tablet Take 50 mg by mouth daily as needed for moderate pain or severe pain.     Marland Kitchen warfarin (COUMADIN) 5 MG tablet TAKE 1 TABLET ON MONDAY, WEDNESDAY, AND FRIDAY. TAKE ONE-HALF (1/2) TABLET (2.5 MG) ALL OTHER DAYS 90 tablet 3  . zolpidem (AMBIEN) 10 MG tablet Take 10 mg by mouth at bedtime as needed for sleep.      No current facility-administered medications on file prior to visit.     Cardiac Studies:   Nuclear stress test [09/22/2015]:  1. Resting EKG demonstrated normal sinus rhythm, normal axis. No evidence of ischemia. Stress EKG is positive for myocardial ischemia. With exercise there is frequent PVCs, ventricular couplets and ventricular complaints, associated with 2 mm horizontal ST segment depression in the inferior and lateral leads. ST segment changes persisted for greater than 3 minutes into recovery. Stress terminated due to fatigue. The patient performed treadmill exercise using a Bruce protocol, completing 6 minutes. The patient completed an estimated workload of 7.05 METS. 2. Myocardial perfusion imaging is normal. Overall left ventricular systolic function was normal without regional wall motion abnormalities. The left ventricular ejection fraction was 89%. This is an intermediate risk study, clinical correlation recommended.This is an intermediate risk study due to arrhythmias and ST changes of ischemia. Multivessel disease and balanced ischemia cannot be excluded. Consider further cardiac work-up.  Coronary Angiogram [10/16/2015]:  Normal coronary arteries, mild coronary calcification. 3 plus MR. Mild pulmonary hypertension with right heart suggestive of moderate to severe MR. Preserved CO/CI by [place].  MV repair in [place] 11/26/2015: Complex MV repair with neo-chordal reconstruction anterior and posterior leaflets with 24 CG ring and ligation  of left atrial appendage with 45 Atriclip device.  Carotid artery duplex  07/27/2019: Duplex suggests stenosis in the right internal carotid artery (50-69%). Duplex suggests stenosis in the left internal carotid artery (50-69%). Antegrade right vertebral artery flow. Antegrade left vertebral artery flow. Follow up in six months is appropriate if clinically indicated. compared to 01/09/2019, there is mild progression of right ICA stenosis from l >50%.  PCV ECHOCARDIOGRAM COMPLETE 96/75/9163 Normal LV systolic function with visual EF 50-55%. Left ventricle cavity is normal in size. Normal global wall motion. Unable to evaluate diastolic function due to mitral valve repair.  Elevated LAP. Mild (Grade I) aortic regurgitation. S/P mitral valvuloplasty well seated without evidence of dehiscence. Mild mitral stenosis. Mild (Grade I) mitral regurgitation. Mild tricuspid regurgitation. Compared to prior study dated 03/16/2018 no significant changes.  EKG:   EKG 02/27/2020: Normal sinus rhythm with sinus arrhythmia at the rate of 57 bpm, left atrial enlargement, normal axis.  Single PAC.  No significant change from 10 08/2018.  Assessment     ICD-10-CM   1. Essential hypertension  I10   2. Hypercholesteremia  E78.00   3. Asymptomatic carotid artery stenosis without infarction, bilateral  I65.23 PCV CAROTID DUPLEX (BILATERAL)  4. S/P mitral valve repair 11/26/2015  Z98.890    No orders of the defined types were placed in this encounter.  Medications Discontinued During This Encounter  Medication Reason  . hydrochlorothiazide (MICROZIDE) 12.5 MG capsule Discontinued by provider    Recommendations:   Laura Knapp is a 69 y.o. Caucasian female with MVP and S/P MV repair and paroxysmal A Fib maintains sinus on Sotalol presents for 6 month f/u. Essentially asymptomatic. Patient underwent complex mitral valve repair with neo-chordal reconstruction and LAA ligation on 11/26/2015 at [place],  [place].  I had seen her 6 weeks ago and felt that her mitral regurgitation murmur was much more prominent, obtain an echocardiogram, also added hydralazine for hypertension and she now presents for follow-up.  Blood pressure is now well controlled.  She is on Benicar HCT and also HCTZ, in view of hyponatremia, I will discontinue plain HCTZ.  Hyponatremia is very mild.  Reviewed the echocardiogram, fortunately suspected that her mitral regurgitation is worse, appears very stable and mild.  There is no clinical evidence of heart failure.  Lipids are well controlled.  Continue present dose statins.  She is tolerating anticoagulation for paroxysmal atrial fibrillation and maintained sinus rhythm.  I will see her back in 6 months for follow-up of her cardiac and vascular issues.   Adrian Prows, MD, [facility] 04/22/2020, 2:00 PM Office: [phone] Pager: [phone]

## 2020-06-09 ENCOUNTER — Other Ambulatory Visit: Payer: Self-pay | Admitting: Cardiology

## 2020-07-03 ENCOUNTER — Other Ambulatory Visit: Payer: Self-pay | Admitting: Cardiology

## 2020-08-07 ENCOUNTER — Other Ambulatory Visit: Payer: Self-pay | Admitting: Cardiology

## 2020-08-07 DIAGNOSIS — I10 Essential (primary) hypertension: Secondary | ICD-10-CM

## 2020-08-11 ENCOUNTER — Telehealth: Payer: Self-pay

## 2020-08-11 ENCOUNTER — Other Ambulatory Visit: Payer: Self-pay

## 2020-08-11 MED ORDER — METOPROLOL SUCCINATE ER 50 MG PO TB24: 50.0000 mg | ORAL_TABLET | Freq: Every day | ORAL | 3 refills | Status: DC

## 2020-08-11 NOTE — Telephone Encounter (Signed)
Patient called wondering if she needs to be on toprol 50mg  daily please advise

## 2020-08-11 NOTE — Telephone Encounter (Signed)
She is doing well on the medication and needs if for atrial fibrillation

## 2020-08-11 NOTE — Telephone Encounter (Signed)
Spoke to patient and sent refills

## 2020-10-21 ENCOUNTER — Ambulatory Visit: Payer: Medicare Other

## 2020-10-21 ENCOUNTER — Other Ambulatory Visit: Payer: Self-pay

## 2020-10-21 ENCOUNTER — Other Ambulatory Visit: Payer: TRICARE For Life (TFL)

## 2020-10-21 DIAGNOSIS — I6523 Occlusion and stenosis of bilateral carotid arteries: Secondary | ICD-10-CM

## 2020-10-22 NOTE — Progress Notes (Signed)
Carotid artery duplex 10/21/2020: Duplex suggests stenosis in the right internal carotid artery (50-69%). Duplex suggests stenosis in the left internal carotid artery (50-69%). Antegrade right vertebral artery flow. Antegrade left vertebral artery flow. Follow up in six months is appropriate if clinically indicated. compared to 07/27/2019, there is no significant change.    Will discuss on OV soon.

## 2020-10-27 ENCOUNTER — Ambulatory Visit: Payer: TRICARE For Life (TFL) | Admitting: Cardiology

## 2020-11-03 ENCOUNTER — Ambulatory Visit: Payer: Medicare Other | Admitting: Cardiology

## 2020-11-03 ENCOUNTER — Encounter: Payer: Self-pay | Admitting: Cardiology

## 2020-11-03 ENCOUNTER — Other Ambulatory Visit: Payer: Self-pay

## 2020-11-03 VITALS — BP 157/80 | HR 78 | Temp 98.5°F | Ht 64.0 in | Wt 106.0 lb

## 2020-11-03 DIAGNOSIS — R9431 Abnormal electrocardiogram [ECG] [EKG]: Secondary | ICD-10-CM

## 2020-11-03 DIAGNOSIS — I480 Paroxysmal atrial fibrillation: Secondary | ICD-10-CM

## 2020-11-03 DIAGNOSIS — I6523 Occlusion and stenosis of bilateral carotid arteries: Secondary | ICD-10-CM

## 2020-11-03 DIAGNOSIS — I5033 Acute on chronic diastolic (congestive) heart failure: Secondary | ICD-10-CM

## 2020-11-03 DIAGNOSIS — E7800 Pure hypercholesterolemia, unspecified: Secondary | ICD-10-CM

## 2020-11-03 DIAGNOSIS — Z9889 Other specified postprocedural states: Secondary | ICD-10-CM

## 2020-11-03 DIAGNOSIS — R600 Localized edema: Secondary | ICD-10-CM

## 2020-11-03 NOTE — Progress Notes (Signed)
Primary Physician/Referring:  Aletha Halim., PA-C  Patient ID: Laura Knapp, female    DOB: June 25, 1950, 70 y.o.   MRN: [id number]  Chief Complaint  Patient presents with  . Hypertension  . Atrial Fibrillation  . Follow-up  . Leg Swelling   HPI:    Laura Knapp  is a 70 y.o. Caucasian female with MVP and S/P MV repair and paroxysmal A Fib maintains sinus on Sotalol presents for 6 month f/u. Essentially asymptomatic. Patient underwent complex mitral valve repair with neo-chordal reconstruction and LAA ligation on 11/26/2015 at [place], [place].  She has history of GI bleed in 2017 and since she has had polypectomy no further bleeding. Tolerating Warfarin, being managed by her PCP.  She has discontinued hydralazine.  Her main complaint today is worsening leg edema but no other symptoms, patient states that she has restarted taking furosemide every other day since onset of leg edema about a month ago.  Denies palpitations, denies PND or orthopnea, no chest pain or syncope.  Past Medical History:  Diagnosis Date  . Arrhythmia    atrial fibrillation  . Arthritis    Back  . Atrial fibrillation ([place])   . Cancer ([place])    skin cancer nose- squamous  . 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 Dyspnea    with exertion  . GERD (gastroesophageal reflux disease)   . Glaucoma   . Heart murmur   . Hepatitis    as a child  . HOH (hard of hearing)   . Hypertension   . Pulmonary hypertension ([place])   . 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 ABDOMINAL HYSTERECTOMY     partial  . BREAST LUMPECTOMY     BOTH BREAST  . CARDIAC CATHETERIZATION N/A 10/16/2015   Procedure: Right/Left Heart Cath and Coronary Angiography;  Surgeon: Adrian Prows, MD;  Location: [facility];  Service: Cardiovascular;  Laterality: N/A;  . COLONOSCOPY    . EYE SURGERY Right    x  3 for Glaucoma  . MITRAL VALVE REPAIR  11/26/2015   [facility]   . PERIPHERAL VASCULAR CATHETERIZATION  10/16/2015   Procedure: Thoracic Aortogram;  Surgeon: Adrian Prows, MD;  Location: [facility];  Service: Cardiovascular;;  . SKIN CANCER EXCISION     nose  . TEE WITHOUT CARDIOVERSION N/A 09/09/2015   Procedure: TRANSESOPHAGEAL ECHOCARDIOGRAM (TEE);  Surgeon: Adrian Prows, MD;  Location: [facility];  Service: Cardiovascular;  Laterality: N/A;  . [place]  . ZENKER'S DIVERTICULECTOMY ENDOSCOPIC  12/21/2017  . ZENKER'S DIVERTICULECTOMY ENDOSCOPIC N/A 12/21/2017   Procedure: UDJSHF'W DIVERTICULECTOMY ENDOSCOPIC;  Surgeon: Izora Gala, MD;  Location: [facility];  Service: ENT;  Laterality: N/A;   Social History   Tobacco Use  . Smoking status: Former    Packs/day: 0.25    Years: 20.00    Pack years: 5.00    Types: Cigarettes    Quit date: 1990    Years since quitting: 32.4  . Smokeless tobacco: Former    Quit date: 03/04/1990  . Tobacco comments:    was a light smoker , husband smokes  Substance Use Topics  . Alcohol use: No    Alcohol/week: 0.0 standard drinks   Marital Status: Married   ROS  Review of Systems  HENT:  Positive for hearing loss.   Cardiovascular:  Positive  for leg swelling. Negative for chest pain and dyspnea on exertion.  Gastrointestinal:  Negative for melena.  Objective   Vitals with BMI 11/03/2020 04/22/2020 02/27/2020  Height $Remov'5\' 4"'cpNYBq$  $Remove'5\' 4"'lmSkVir$  $RemoveB'5\' 4"'nyfRphMq$   Weight 106 lbs 109 lbs 110 lbs  BMI 18.19 51.7 61.60  Systolic 737 106 269  Diastolic 80 74 69  Pulse 51 93 51    Blood pressure (!) 157/80, pulse (!) 51, temperature 98.5 F (36.9 C), temperature source Temporal, height $RemoveBeforeDE'5\' 4"'kFQDSrxPUQRpIza$  (1.626 m), weight 106 lb (48.1 kg), SpO2 96 %. Body mass index is 18.19 kg/m.   Physical Exam Constitutional:      Comments: She is petite, no acute distress.  HENT:     Head: Atraumatic.  Neck:     Thyroid: No thyromegaly.     Vascular: No carotid bruit  or JVD.  Cardiovascular:     Rate and Rhythm: Normal rate. Rhythm irregular.     Pulses: Normal pulses and intact distal pulses.     Heart sounds: S1 normal and S2 normal. Murmur heard.  Midsystolic murmur is present with a grade of 3/6 at the apex.  Middiastolic murmur is present with a grade of 2/4 at the apex.    No gallop.  Pulmonary:     Effort: Pulmonary effort is normal.     Breath sounds: Rales (scattered, diffuse) present.  Abdominal:     General: Bowel sounds are normal.     Palpations: Abdomen is soft.  Musculoskeletal:        General: Swelling (2+ bilateral leg edema) present. Normal range of motion.     Cervical back: Neck supple.  Skin:    General: Skin is warm.     Capillary Refill: Capillary refill takes less than 2 seconds.  Neurological:     Mental Status: She is alert.   Radiology:   No results found.  Laboratory examination:   Recent Labs    03/18/20 0934  NA 130*  K 4.8  CL 93*  CO2 22  GLUCOSE 90  BUN 12  CREATININE 1.09*  CALCIUM 9.6  GFRNONAA 52*  GFRAA 59*   External labs:  Labs 10/22/2020:  Hb 11.9/HCT 34.8, platelets 195.  Normal indicis.  INR 2.21.  Sodium 135, potassium 4.3, BUN 13, creatinine 0.90, EGFR 68 mL, CMP otherwise normal.  TSH normal at 2.19.  Total cholesterol 121, triglycerides 52, HDL 74, LDL 37.  Lab 02/05/2020:   Hb 12.6/HCT 36.7, platelets 229.  Medications and allergies   Allergies  Allergen Reactions  . Acetazolamide Rash    Rash on lower extremities coupled with sudden numbness in fingers and toes - appeared after two doses  . Amlodipine Other (See Comments)    Fatigue  . Codeine Itching  . Lisinopril Cough    Current Outpatient Medications on File Prior to Visit  Medication Sig Dispense Refill  . acetaminophen (TYLENOL) 325 MG tablet Take 325 mg by mouth daily as needed for moderate pain.     Marland Kitchen atorvastatin (LIPITOR) 40 MG tablet TAKE 1 TABLET DAILY 90 tablet 3  . brimonidine (ALPHAGAN) 0.2 %  ophthalmic solution Place 1 drop into both eyes 3 (three) times daily.     . cetirizine (ZYRTEC) 10 MG tablet Take one pill per day    . cyclobenzaprine (FLEXERIL) 10 MG tablet Take 5-10 mg by mouth daily as needed for muscle spasms.     . dorzolamide-timolol (COSOPT) 22.3-6.8 MG/ML ophthalmic solution Place 1 drop into both eyes 2 (two) times  daily.    Marland Kitchen ENSURE (ENSURE) Take 237 mLs by mouth every other day. Chocolate    . ezetimibe (ZETIA) 10 MG tablet TAKE 1 TABLET IN THE EVENING AFTER DINNER 90 tablet 3  . furosemide (LASIX) 20 MG tablet Take 10 mg by mouth daily. MWF    . latanoprost (XALATAN) 0.005 % ophthalmic solution Place 1 drop into both eyes at bedtime.    . metoprolol succinate (TOPROL XL) 50 MG 24 hr tablet Take 1 tablet (50 mg total) by mouth daily. Take with or immediately following a meal. 90 tablet 3  . olmesartan-hydrochlorothiazide (BENICAR HCT) 20-12.5 MG tablet Take 1 tablet by mouth daily.    Marland Kitchen omeprazole (PRILOSEC) 40 MG capsule Take 40 mg by mouth daily.    . pilocarpine (PILOCAR) 4 % ophthalmic solution Place 1 drop into both eyes 3 (three) times daily.     Marland Kitchen PROAIR HFA 108 (90 Base) MCG/ACT inhaler Inhale into the lungs.    . sotalol (BETAPACE) 160 MG tablet TAKE 1 TABLET TWICE A DAY 180 tablet 3  . traMADol (ULTRAM) 50 MG tablet Take 50 mg by mouth daily as needed for moderate pain or severe pain.     Marland Kitchen warfarin (COUMADIN) 5 MG tablet TAKE 1 TABLET ON MONDAY, WEDNESDAY, AND FRIDAY. TAKE ONE-HALF (1/2) TABLET (2.5 MG) ALL OTHER DAYS 90 tablet 3  . zolpidem (AMBIEN) 10 MG tablet Take 5 mg by mouth at bedtime as needed for sleep.     No current facility-administered medications on file prior to visit.     Cardiac Studies:   Nuclear stress test  [2015-09-29]:  1. Resting EKG demonstrated normal sinus rhythm, normal axis. No evidence of ischemia. Stress EKG is positive for myocardial ischemia. With exercise there is frequent PVCs, ventricular couplets and ventricular  complaints, associated with 2 mm horizontal ST segment depression in the inferior and lateral leads. ST segment changes persisted for greater than 3 minutes into recovery. Stress terminated due to fatigue. The patient performed treadmill exercise using a Bruce protocol, completing 6 minutes. The patient completed an estimated workload of 7.05 METS. 2. Myocardial perfusion imaging is normal. Overall left ventricular systolic function was normal without regional wall motion abnormalities. The left ventricular ejection fraction was 89%. This is an intermediate risk study, clinical correlation recommended.This is an intermediate risk study due to arrhythmias and ST changes of ischemia. Multivessel disease and balanced ischemia cannot be excluded. Consider further cardiac work-up.  Coronary Angiogram  [10/16/2015]: Normal coronary arteries, mild coronary calcification. 3 plus MR. Mild pulmonary hypertension with right heart suggestive of moderate to severe MR. Preserved CO/CI by [facility].  MV repair in [place] 11/26/2015: Complex MV repair with neo-chordal reconstruction anterior and posterior leaflets with 24 CG ring and ligation of left atrial appendage with 45 Atriclip device.  Carotid artery duplex  07/27/2019: Duplex suggests stenosis in the right internal carotid artery (50-69%). Duplex suggests stenosis in the left internal carotid artery (50-69%). Antegrade right vertebral artery flow. Antegrade left vertebral artery flow. Follow up in six months is appropriate if clinically indicated. compared to 01/09/2019, there is mild progression of right ICA stenosis from l >50%.  PCV ECHOCARDIOGRAM COMPLETE 14/78/2956 Normal LV systolic function with visual EF 50-55%. Left ventricle cavity is normal in size. Normal global wall motion. Unable to evaluate diastolic function due to mitral valve repair.  Elevated LAP. Mild (Grade I) aortic regurgitation. S/P mitral valvuloplasty well seated without evidence of  dehiscence. Mild mitral stenosis. Mild (Grade I) mitral  regurgitation. Mild tricuspid regurgitation. Compared to prior study dated 03/16/2018 no significant changes.  Carotid artery duplex 10/21/2020: Duplex suggests stenosis in the right internal carotid artery (50-69%). Duplex suggests stenosis in the left internal carotid artery (50-69%). Antegrade right vertebral artery flow. Antegrade left vertebral artery flow. Follow up in six months is appropriate if clinically indicated. compared to03/09/2019, there is no significant change.  EKG:   EKG 11/03/2020: Sinus bradycardia at rate of 50 bpm, left atrial enlargement.  Frequent PACs and atrial triplets (1), total ventricular rate 78 bpm.  PVCs frequent (4).  No evidence of ischemia.     EKG 02/27/2020: Normal sinus rhythm with sinus arrhythmia at the rate of 57 bpm, left atrial enlargement, normal axis.  Single PAC.  No significant change from 10 08/2018.  Assessment     ICD-10-CM   1. Acute on chronic diastolic heart failure (HCC)  I50.33 Brain natriuretic peptide    2. Bilateral leg edema  R60.0     3. Paroxysmal atrial fibrillation (HCC)  I48.0 EKG 12-Lead    PCV ECHOCARDIOGRAM COMPLETE    4. S/P mitral valve repair  Z98.890 PCV ECHOCARDIOGRAM COMPLETE    5. Abnormal EKG  R94.31     6. Asymptomatic carotid artery stenosis without infarction, bilateral  I65.23     7. Hypercholesteremia  E78.00      No orders of the defined types were placed in this encounter.  Medications Discontinued During This Encounter  Medication Reason  . dexlansoprazole (DEXILANT) 60 MG capsule Completed Course  . diphenhydrAMINE (BENADRYL) 25 MG tablet Error  . hydrALAZINE (APRESOLINE) 25 MG tablet No longer needed (for PRN medications)    Orders Placed This Encounter  Procedures  . Brain natriuretic peptide  . EKG 12-Lead  . PCV ECHOCARDIOGRAM COMPLETE    Standing Status:   Future    Standing Expiration Date:   11/03/2021   Recommendations:    Ms. Laura Knapp is a 70 y.o. Caucasian female with MVP and S/P MV repair and paroxysmal A Fib maintains sinus on Sotalol presents for 6 month f/u. Essentially asymptomatic. Patient underwent complex mitral valve repair with neo-chordal reconstruction and LAA ligation on 11/26/2015 at [place], [place].  This is a 42-monthoffice visit, over the past 1 months she has noticed leg edema that is new to her and has restarted taking furosemide every other day.  I suspect her mitral regurgitation has worsened as the murmur appears much more prominent than last time.  Also previously had suspected mitral regurgitation to be worse however echocardiogram had not suggested this.  I would like to repeat echocardiogram.  EKG is also abnormal today with very frequent PACs and PVCs.  Advised her if she develops any dyspnea or if her leg edema is not responding to every other day Lasix, she should start taking Lasix on a daily basis.  I will obtain a BNP today.  I will address her blood pressure issues on her next office visit.  She has discontinued hydralazine hence elevated blood pressure probably.  Would like to see him back in 4 weeks for follow-up.  She is otherwise tolerating anticoagulation for paroxysmal atrial fibrillation.  I reviewed the carotid artery duplex, will continue surveillance for now.  Lipids are well controlled.  This was a 40-minute office visit encounter in addressing complex medical issues and arrhythmias.    JAdrian Prows MD, [facility]6/13/2022, 11:20 PM Office: [phone]: [phone]

## 2020-11-04 LAB — BRAIN NATRIURETIC PEPTIDE: BNP: 424.9 pg/mL — ABNORMAL HIGH (ref 0.0–100.0)

## 2020-11-04 NOTE — Progress Notes (Signed)
Elevated BNP suggest acute decompensated heart failure.  We will continue to monitor. We will screen her for  LUX-Sx TRENDS study (loop implantation for heart failure sensor), she is willing. Will await echo prior to randomization.   Discussed low salt diet and rest for now. Contact me if symptoms worse.   Please have front office do echo sooner than scheduled.

## 2020-11-24 ENCOUNTER — Other Ambulatory Visit: Payer: Self-pay | Admitting: Cardiology

## 2020-11-25 NOTE — Telephone Encounter (Signed)
Refill request

## 2020-11-26 ENCOUNTER — Ambulatory Visit: Payer: Medicare Other

## 2020-11-26 ENCOUNTER — Other Ambulatory Visit: Payer: Self-pay

## 2020-11-26 DIAGNOSIS — I480 Paroxysmal atrial fibrillation: Secondary | ICD-10-CM

## 2020-11-26 DIAGNOSIS — Z9889 Other specified postprocedural states: Secondary | ICD-10-CM

## 2020-12-04 NOTE — Progress Notes (Signed)
Called patient, NA, LMAM

## 2020-12-04 NOTE — Progress Notes (Signed)
Echocardiogram 11/26/2020: Normal LV systolic function with visual EF 50-55%. Unable to evaluate diastolic function due to mitral valve repair. Left ventricle cavity is normal in size. Normal global wall motion. Mild (Grade I) aortic regurgitation. S/P mitral valvuloplasty well seated without evidence of dehiscence.  Mild mitral stenosis. Mild (Grade I) mitral regurgitation. Mild tricuspid regurgitation. Compared to study dated 11/26/2020 no significant change.  Stable echo from previous

## 2020-12-05 NOTE — Progress Notes (Signed)
Pt aware.

## 2020-12-17 ENCOUNTER — Other Ambulatory Visit: Payer: Self-pay

## 2020-12-17 ENCOUNTER — Encounter: Payer: Self-pay | Admitting: Cardiology

## 2020-12-17 ENCOUNTER — Ambulatory Visit: Payer: Medicare Other | Admitting: Cardiology

## 2020-12-17 VITALS — BP 154/75 | HR 53 | Temp 98.0°F | Resp 16 | Ht 64.0 in | Wt 104.0 lb

## 2020-12-17 DIAGNOSIS — I6523 Occlusion and stenosis of bilateral carotid arteries: Secondary | ICD-10-CM | POA: Insufficient documentation

## 2020-12-17 DIAGNOSIS — I5033 Acute on chronic diastolic (congestive) heart failure: Secondary | ICD-10-CM

## 2020-12-17 DIAGNOSIS — I480 Paroxysmal atrial fibrillation: Secondary | ICD-10-CM

## 2020-12-17 DIAGNOSIS — Z9889 Other specified postprocedural states: Secondary | ICD-10-CM | POA: Insufficient documentation

## 2020-12-17 DIAGNOSIS — I10 Essential (primary) hypertension: Secondary | ICD-10-CM | POA: Insufficient documentation

## 2020-12-17 MED ORDER — HYDRALAZINE HCL 50 MG PO TABS
50.0000 mg | ORAL_TABLET | Freq: Three times a day (TID) | ORAL | 3 refills | Status: DC
Start: 2020-12-17 — End: 2021-11-25

## 2020-12-17 NOTE — Progress Notes (Signed)
Primary Physician/Referring:  Aletha Halim., PA-C  Patient ID: Laura Knapp, female    DOB: 29-Jun-1950, 70 y.o.   MRN: [id number]  Chief Complaint  Patient presents with   Acute on chronic diastolic heart failure    Edema   HPI:    Laura Knapp  is a 70 y.o. Caucasian female with MVP and S/P MV repair along with LAA ligation on 11/26/2015 at [facility], [place], [place] and paroxysmal A Fib maintains sinus on Sotalol presents for 6 week follow-up visit for acute on chronic diastolic heart failure.   History significant for primary hypertension hyperlipidemia, asymptomatic bilateral carotid artery stenosis, history of GI bleeding 2017 SP polypectomy with no recurrence and tolerating warfarin being managed by her PCP. She was seen by me 6 weeks ago for worsening dyspnea and leg edema and was started on furosemide, underwent echocardiogram and presents for follow-up.    She has noticed marked improvement in leg edema, she has reduced taking furosemide back to 3 times a week instead of daily basis.   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 ,double vision   Past Surgical History:  Procedure Laterality Date   ABDOMINAL HYSTERECTOMY     partial   BREAST LUMPECTOMY     BOTH BREAST   CARDIAC CATHETERIZATION N/A 10/16/2015   Procedure: Right/Left Heart Cath and Coronary Angiography;  Surgeon: Adrian Prows, MD;  Location: [place] [facility];  Service: Cardiovascular;  Laterality: N/A;   COLONOSCOPY     EYE SURGERY Right    x 3  for Glaucoma   MITRAL VALVE REPAIR  11/26/2015   [facility]    PERIPHERAL VASCULAR CATHETERIZATION  10/16/2015   Procedure: Thoracic Aortogram;  Surgeon: Adrian Prows, MD;  Location: [place] [facility];  Service: Cardiovascular;;   SKIN CANCER EXCISION     nose   TEE WITHOUT CARDIOVERSION N/A 09/09/2015   Procedure: TRANSESOPHAGEAL ECHOCARDIOGRAM (TEE);  Surgeon: Adrian Prows, MD;  Location: [place];  Service: Cardiovascular;  Laterality: N/A;   TUBAL LIGATION  1983   ZENKER'S DIVERTICULECTOMY ENDOSCOPIC  12/21/2017   ZENKER'S DIVERTICULECTOMY ENDOSCOPIC N/A 12/21/2017   Procedure: CMKLKJ'Z DIVERTICULECTOMY ENDOSCOPIC;  Surgeon: Izora Gala, MD;  Location: [place];  Service: ENT;  Laterality: N/A;   Social History   Tobacco Use   Smoking status: Former    Packs/day: 0.25    Years: 20.00    Pack years: 5.00    Types: Cigarettes    Quit date: 1990    Years since quitting: 32.5   Smokeless tobacco: Former    Quit date: 03/04/1990   Tobacco comments:    was a light smoker , husband smokes  Substance Use Topics   Alcohol use: No    Alcohol/week: 0.0 standard drinks   Marital Status: Married   ROS  Review of Systems  HENT:  Positive for hearing loss.  Cardiovascular:  Positive for leg swelling. Negative for chest pain and dyspnea on exertion.  Gastrointestinal:  Negative for melena.  Objective   Vitals with BMI 12/17/2020 12/17/2020 11/03/2020  Height - _0  _1   Weight - 104 lbs 106 lbs  BMI - 88.41 66.06  Systolic 301 601 093  Diastolic 75 72 80  Pulse 53 56 78    Blood pressure (!) 154/75, pulse (!) 53, temperature 98 F (36.7 C), temperature source Temporal, resp. rate 16, height _2  (1.626 m), weight 104 lb (47.2 kg), SpO2 94 %. Body mass index is 17.85 kg/m.   Physical Exam Constitutional:      Comments: She is petite, no acute distress.  HENT:     Head: Atraumatic.  Neck:     Thyroid: No thyromegaly.     Vascular: No carotid bruit or JVD.   Cardiovascular:     Rate and Rhythm: Normal rate and regular rhythm. Occasional Extrasystoles are present.    Pulses: Normal pulses and intact distal pulses.     Heart sounds: S1 normal and S2 normal. Murmur heard.  Midsystolic murmur is present with a grade of 3/6 at the apex.  Middiastolic murmur is present with a grade of 2/4 at the apex.    No gallop.  Pulmonary:     Effort: Pulmonary effort is normal.     Breath sounds: Rales (scattered, diffuse) present.  Abdominal:     General: Bowel sounds are normal.     Palpations: Abdomen is soft.  Musculoskeletal:        General: Swelling (2+ bilateral leg edema) present. Normal range of motion.     Cervical back: Neck supple.  Skin:    General: Skin is warm.     Capillary Refill: Capillary refill takes less than 2 seconds.  Neurological:     Mental Status: She is alert.   Radiology:   No results found.  Laboratory examination:   CMP Latest Ref Rng & Units 03/18/2020 01/18/2019 12/16/2017  Glucose 65 - 99 mg/dL 90 88 98  BUN 8 - 27 mg/dL _3 Creatinine 0.57 - 1.00 mg/dL 1.09(H) 0.97 1.29(H)  Sodium 134 - 144 mmol/L 130(L) 137 131(L)  Potassium 3.5 - 5.2 mmol/L 4.8 4.7 5.0  Chloride 96 - 106 mmol/L 93(L) 95(L) 101  CO2 20 - 29 mmol/L _4 Calcium 8.7 - 10.3 mg/dL 9.6 9.6 8.9   CBC Latest Ref Rng & Units 01/18/2019 12/21/2017 12/16/2017  WBC 3.4 - 10.8 x10E3/uL 4.5 5.8 3.8(L)  Hemoglobin 11.1 - 15.9 g/dL 13.3 10.7(L) 11.6(L)  Hematocrit 34.0 - 46.6 % 39.4 32.1(L) 34.9(L)  Platelets 150 - 450 x10E3/uL 225 145(L) 233   Lipid Panel     Component Value Date/Time   CHOL 122 03/18/2020 0934   TRIG 57 03/18/2020 0934   HDL 59 03/18/2020 0934   LDLCALC 50 03/18/2020 0934   BNP (last 3 results) Recent Labs    11/03/20 1528  BNP 424.9*   ProBNP (last 3 results) No results for input(s): PROBNP in the last 8760 hours.  External labs:  Labs 10/22/2020:  Hb 11.9/HCT 34.8, platelets 195.  Normal indicis.  INR  2.21.  Sodium 135, potassium 4.3, BUN 13, creatinine 0.90, EGFR 68 mL, CMP otherwise normal.  TSH normal at 2.19.  Total cholesterol 121, triglycerides 52, HDL 74, LDL 37.  Lab 02/05/2020:   Hb 12.6/HCT 36.7, platelets 229.  Medications and allergies   Allergies  Allergen Reactions  Acetazolamide Rash    Rash on lower extremities coupled with sudden numbness in fingers and toes - appeared after two doses   Amlodipine Other (See Comments)    Fatigue   Codeine Itching   Lisinopril Cough    Current Outpatient Medications on File Prior to Visit  Medication Sig Dispense Refill   acetaminophen (TYLENOL) 325 MG tablet Take 325 mg by mouth daily as needed for moderate pain.      atorvastatin (LIPITOR) 40 MG tablet TAKE 1 TABLET DAILY 90 tablet 3   brimonidine (ALPHAGAN) 0.2 % ophthalmic solution Place 1 drop into both eyes 3 (three) times daily.      cetirizine (ZYRTEC) 10 MG tablet Take one pill per day     cyclobenzaprine (FLEXERIL) 10 MG tablet Take 5-10 mg by mouth daily as needed for muscle spasms.      dorzolamide-timolol (COSOPT) 22.3-6.8 MG/ML ophthalmic solution Place 1 drop into both eyes 2 (two) times daily.     ENSURE (ENSURE) Take 237 mLs by mouth every other day. Chocolate     ezetimibe (ZETIA) 10 MG tablet TAKE 1 TABLET IN THE EVENING AFTER DINNER 90 tablet 3   furosemide (LASIX) 20 MG tablet Take 10 mg by mouth daily. MWF     latanoprost (XALATAN) 0.005 % ophthalmic solution Place 1 drop into both eyes at bedtime.     metoprolol succinate (TOPROL XL) 50 MG 24 hr tablet Take 1 tablet (50 mg total) by mouth daily. Take with or immediately following a meal. 90 tablet 3   olmesartan-hydrochlorothiazide (BENICAR HCT) 20-12.5 MG tablet Take 1 tablet by mouth daily.     omeprazole (PRILOSEC) 40 MG capsule Take 40 mg by mouth daily.     pilocarpine (PILOCAR) 4 % ophthalmic solution Place 1 drop into both eyes 3 (three) times daily.      PROAIR HFA 108 (90 Base) MCG/ACT inhaler  Inhale into the lungs.     sotalol (BETAPACE) 160 MG tablet TAKE 1 TABLET TWICE A DAY 180 tablet 3   traMADol (ULTRAM) 50 MG tablet Take 50 mg by mouth daily as needed for moderate pain or severe pain.      warfarin (COUMADIN) 5 MG tablet TAKE 1 TABLET ON MONDAY, WEDNESDAY, AND FRIDAY. TAKE ONE-HALF (1/2) TABLET (2.5 MG) ALL OTHER DAYS 90 tablet 3   zolpidem (AMBIEN) 10 MG tablet Take 5 mg by mouth at bedtime as needed for sleep.     No current facility-administered medications on file prior to visit.     Cardiac Studies:   Nuclear stress test  [Oct 02, 2015]:  1. Resting EKG demonstrated normal sinus rhythm, normal axis. No evidence of ischemia. Stress EKG is positive for myocardial ischemia. With exercise there is frequent PVCs, ventricular couplets and ventricular complaints, associated with 2 mm horizontal ST segment depression in the inferior and lateral leads. ST segment changes persisted for greater than 3 minutes into recovery. Stress terminated due to fatigue. The patient performed treadmill exercise using a Bruce protocol, completing 6 minutes. The patient completed an estimated workload of 7.05 METS. 2. Myocardial perfusion imaging is normal. Overall left ventricular systolic function was normal without regional wall motion abnormalities. The left ventricular ejection fraction was 89%. This is an intermediate risk study, clinical correlation recommended.This is an intermediate risk study due to arrhythmias and ST changes of ischemia. Multivessel disease and balanced ischemia cannot be excluded. Consider further cardiac work-up.  Coronary Angiogram  [10/16/2015]: Normal coronary arteries, mild coronary calcification. 3 plus MR. Mild  pulmonary hypertension with right heart suggestive of moderate to severe MR. Preserved CO/CI by Fick.  MV repair in [place] 11/26/2015: Complex MV repair with neo-chordal reconstruction anterior and posterior leaflets with 24 CG ring and ligation of left atrial  appendage with 45 Atriclip device.  Carotid artery duplex 10/21/2020: Duplex suggests stenosis in the right internal carotid artery (50-69%). Duplex suggests stenosis in the left internal carotid artery (50-69%). Antegrade right vertebral artery flow. Antegrade left vertebral artery flow. Follow up in six months is appropriate if clinically indicated. compared to03/09/2019, there is no significant change.  PCV ECHOCARDIOGRAM COMPLETE 51/76/1607 Normal LV systolic function with visual EF 50-55%. Unable to evaluate diastolic function due to mitral valve repair. Left ventricle cavity is normal in size. Normal global wall motion. Mild (Grade I) aortic regurgitation. S/P mitral valvuloplasty well seated without evidence of dehiscence.  Mild mitral stenosis. Mild (Grade I) mitral regurgitation. Mild tricuspid regurgitation. Compared to study dated 11/26/2020 no significant change.    EKG:   EKG 12/17/2020: Normal sinus rhythm at rate of 65 bpm, left atrial enlargement, normal axis.  Incomplete right bundle branch block.  PVCs (2).  Compared to 11/03/2020, heart rate is improved from 50 bpm and frequent PACs not present.  Frequent PVCs not present.   EKG 02/27/2020: Normal sinus rhythm with sinus arrhythmia at the rate of 57 bpm, left atrial enlargement, normal axis.  Single PAC.  No significant change from 10 08/2018.  Assessment     ICD-10-CM   1. Acute on chronic diastolic heart failure (HCC)  I50.33 EKG 12-Lead    2. Paroxysmal atrial fibrillation (HCC)  I48.0     3. S/P mitral valve repair  Z98.890     4. Asymptomatic carotid artery stenosis without infarction, bilateral  I65.23     5. Primary hypertension  I10 hydrALAZINE (APRESOLINE) 50 MG tablet     Meds ordered this encounter  Medications   hydrALAZINE (APRESOLINE) 50 MG tablet    Sig: Take 1 tablet (50 mg total) by mouth 3 (three) times daily.    Dispense:  270 tablet    Refill:  3    Medications Discontinued During This  Encounter  Medication Reason   hydrALAZINE (APRESOLINE) 25 MG tablet Error   hydrALAZINE (APRESOLINE) 25 MG tablet Dose change    Orders Placed This Encounter  Procedures   EKG 12-Lead   Recommendations:   Ms. Jonesha Tsuchiya is a 70 y.o. Caucasian female with MVP and S/P MV repair along with LAA ligation on 11/26/2015 at [facility], [place], [place] and paroxysmal A Fib maintains sinus on Sotalol presents for 6 week follow-up visit for acute on chronic diastolic heart failure.   History significant for primary hypertension hyperlipidemia, asymptomatic bilateral carotid artery stenosis, history of GI bleeding 2017 SP polypectomy with no recurrence and tolerating warfarin being managed by her PCP.  She was started on furosemide on her last office visit for leg edema and worsening dyspnea and acute decompensated diastolic heart failure and underwent repeat echocardiogram.  She has noticed marked improvement in leg edema, she has reduced taking furosemide back to 3 times a week.  I reviewed her echocardiogram, fortunately mitral regurgitation has not worsened and LVEF has remained stable.  EKG now reveals decreased PACs and PVCs today.  She is maintaining sinus rhythm.  She is otherwise tolerating anticoagulation for paroxysmal atrial fibrillation.  With regard to hypertension, will increase hydralazine to 50 mg p.o. 3 times daily.  Patient will be screened into LUX-Sx TRENDS study (  loop implantation for heart failure sensor) if she is willing to participate.   Unless problems, I will see her back in 6 months for follow-up.    Adrian Prows, MD, [facility] 12/17/2020, 2:04 PM Office: [phone] Pager: [phone]

## 2020-12-24 ENCOUNTER — Other Ambulatory Visit: Payer: Self-pay | Admitting: Cardiology

## 2020-12-24 NOTE — Telephone Encounter (Signed)
Managed through PCP office. Unable to find on recipients list.

## 2020-12-24 NOTE — Telephone Encounter (Signed)
Refill

## 2021-05-05 ENCOUNTER — Other Ambulatory Visit: Payer: TRICARE For Life (TFL)

## 2021-06-04 ENCOUNTER — Other Ambulatory Visit: Payer: Self-pay | Admitting: Cardiology

## 2021-06-09 ENCOUNTER — Other Ambulatory Visit: Payer: TRICARE For Life (TFL)

## 2021-06-09 ENCOUNTER — Other Ambulatory Visit: Payer: Self-pay

## 2021-06-09 ENCOUNTER — Ambulatory Visit: Payer: TRICARE For Life (TFL)

## 2021-06-09 DIAGNOSIS — I6523 Occlusion and stenosis of bilateral carotid arteries: Secondary | ICD-10-CM

## 2021-06-15 NOTE — Progress Notes (Signed)
I will discuss at office visit soon on 06/19/2021

## 2021-06-19 ENCOUNTER — Ambulatory Visit: Payer: Medicare Other | Admitting: Cardiology

## 2021-06-19 ENCOUNTER — Other Ambulatory Visit: Payer: Self-pay

## 2021-06-19 ENCOUNTER — Encounter: Payer: Self-pay | Admitting: Cardiology

## 2021-06-19 VITALS — BP 138/89 | HR 66 | Temp 97.6°F | Resp 17 | Ht 64.0 in | Wt 102.0 lb

## 2021-06-19 DIAGNOSIS — N1831 Chronic kidney disease, stage 3a: Secondary | ICD-10-CM

## 2021-06-19 DIAGNOSIS — I10 Essential (primary) hypertension: Secondary | ICD-10-CM

## 2021-06-19 DIAGNOSIS — I6523 Occlusion and stenosis of bilateral carotid arteries: Secondary | ICD-10-CM

## 2021-06-19 DIAGNOSIS — Z9889 Other specified postprocedural states: Secondary | ICD-10-CM

## 2021-06-19 DIAGNOSIS — I480 Paroxysmal atrial fibrillation: Secondary | ICD-10-CM

## 2021-06-19 NOTE — Progress Notes (Signed)
Primary Physician/Referring:  Aletha Halim., PA-C  Patient ID: Laura Knapp, female    DOB: 1950-11-19, 71 y.o.   MRN: [id number]  Chief Complaint  Patient presents with   chronic diastolic heart failure    6 month   Atrial Fibrillation   Hypertension   mitral valve disease   HPI:    Laura Knapp  is a 71 y.o. Caucasian female with MVP and S/P MV repair along with LAA ligation on 11/26/2015 at [facility], [place], [place] and paroxysmal A Fib maintains sinus on Sotalol presents for follow-up.  Past medical history significant for hypertension, hyperlipidemia, asymptomatic bilateral carotid artery stenosis and chronic diastolic heart failure.  Since being on furosemide 10 mg every other day she has not had any further heart failure exacerbations.  Except for chronic mild dyspnea she has no other specific complaints today.  Tolerating anticoagulation without bleeding diathesis.  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 ,double vision   Past Surgical History:  Procedure Laterality Date   ABDOMINAL HYSTERECTOMY     partial   BREAST LUMPECTOMY     BOTH BREAST   CARDIAC CATHETERIZATION N/A 10/16/2015   Procedure: Right/Left Heart Cath and Coronary Angiography;  Surgeon: Adrian Prows, MD;  Location: [place] [facility];  Service: Cardiovascular;  Laterality: N/A;   COLONOSCOPY     EYE SURGERY Right    x 3 for Glaucoma   MITRAL VALVE REPAIR  11/26/2015   [facility]    PERIPHERAL VASCULAR CATHETERIZATION   10/16/2015   Procedure: Thoracic Aortogram;  Surgeon: Adrian Prows, MD;  Location: [place] [facility];  Service: Cardiovascular;;   SKIN CANCER EXCISION     nose   TEE WITHOUT CARDIOVERSION N/A 09/09/2015   Procedure: TRANSESOPHAGEAL ECHOCARDIOGRAM (TEE);  Surgeon: Adrian Prows, MD;  Location: [facility];  Service: Cardiovascular;  Laterality: N/A;   TUBAL LIGATION  1983   ZENKER'S DIVERTICULECTOMY ENDOSCOPIC  12/21/2017   ZENKER'S DIVERTICULECTOMY ENDOSCOPIC N/A 12/21/2017   Procedure: [id number] DIVERTICULECTOMY ENDOSCOPIC;  Surgeon: Izora Gala, MD;  Location: [place];  Service: ENT;  Laterality: N/A;   Social History   Tobacco Use   Smoking status: Former    Packs/day: 0.25    Years: 20.00    Pack years: 5.00    Types: Cigarettes    Quit date: 1990    Years since quitting: 33.0   Smokeless tobacco: Former    Quit date: 03/04/1990   Tobacco comments:    was a light smoker , husband smokes  Substance Use Topics   Alcohol use: No    Alcohol/week: 0.0 standard drinks   Marital Status: Married   ROS  Review of Systems  HENT:  Positive for hearing loss.   Cardiovascular:  Negative for chest pain, dyspnea on exertion and leg swelling.  Gastrointestinal:  Negative for melena.  Objective   Vitals with BMI  06/19/2021 12/17/2020 12/17/2020  Height _0  - _1   Weight 102 lbs - 104 lbs  BMI 87.5 - 79.72  Systolic 820 601 561  Diastolic 89 75 72  Pulse 66 53 56    Blood pressure 138/89, pulse 66, temperature 97.6 F (36.4 C), temperature source Temporal, resp. rate 17, height _2  (1.626 m), weight 102 lb (46.3 kg), SpO2 96 %. Body mass index is 17.51 kg/m.   Physical Exam Constitutional:      Comments: She is petite, no acute distress.  HENT:     Head: Atraumatic.  Neck:     Thyroid: No thyromegaly.     Vascular: No carotid bruit or JVD.  Cardiovascular:     Rate and Rhythm: Normal rate and regular rhythm. Occasional Extrasystoles are present.    Pulses: Normal pulses and  intact distal pulses.     Heart sounds: S1 normal and S2 normal. Murmur heard.  Midsystolic murmur is present with a grade of 3/6 at the apex.  Middiastolic murmur is present with a grade of 2/4 at the apex.    No gallop.  Pulmonary:     Effort: Pulmonary effort is normal.     Breath sounds: Rales (scattered, diffuse) present.  Abdominal:     General: Bowel sounds are normal.     Palpations: Abdomen is soft.  Musculoskeletal:     Right lower leg: No edema.     Left lower leg: No edema.  Skin:    Capillary Refill: Capillary refill takes less than 2 seconds.   Radiology:   No results found.  Laboratory examination:    External labs:  Labs 04/30/2021:  Sodium 133, potassium 3.9, BUN 14, creatinine 1.17, EGFR 50 mL, LFTs normal.  Compared to 10/22/2020, creatinine 0.90 and EGFR was normal.  Total cholesterol 120, triglycerides 52, HDL 61, LDL 40.  Hb 12.3/HCT 37.2, platelets 207, normal indicis.  Medications and allergies   Allergies  Allergen Reactions   Acetazolamide Rash    Rash on lower extremities coupled with sudden numbness in fingers and toes - appeared after two doses   Amlodipine Other (See Comments)    Fatigue   Codeine Itching   Lisinopril Cough    Current Outpatient Medications on File Prior to Visit  Medication Sig Dispense Refill   acetaminophen (TYLENOL) 325 MG tablet Take 325 mg by mouth daily as needed for moderate pain.      atorvastatin (LIPITOR) 40 MG tablet TAKE 1 TABLET DAILY 90 tablet 3   brimonidine (ALPHAGAN) 0.2 % ophthalmic solution Place 1 drop into both eyes 3 (three) times daily.      cetirizine (ZYRTEC) 10 MG tablet Take one pill per day     cyclobenzaprine (FLEXERIL) 10 MG tablet Take 5-10 mg by mouth daily as needed for muscle spasms.      dorzolamide-timolol (COSOPT) 22.3-6.8 MG/ML ophthalmic solution Place 1 drop into both eyes 2 (two) times daily.     ENSURE (ENSURE) Take 237 mLs by mouth every other day. Chocolate     ezetimibe  (ZETIA) 10 MG tablet TAKE 1 TABLET IN THE EVENING AFTER DINNER 90 tablet 3   fluticasone (FLONASE) 50 MCG/ACT nasal spray Place 2 sprays into both nostrils 2 (two) times daily.     furosemide (LASIX) 20 MG tablet Take 10 mg by mouth daily. MWF     hydrALAZINE (APRESOLINE) 50 MG tablet Take 1 tablet (50 mg total) by mouth 3 (three) times daily. 270 tablet 3  latanoprost (XALATAN) 0.005 % ophthalmic solution Place 1 drop into both eyes at bedtime.     metoprolol succinate (TOPROL XL) 50 MG 24 hr tablet Take 1 tablet (50 mg total) by mouth daily. Take with or immediately following a meal. 90 tablet 3   olmesartan-hydrochlorothiazide (BENICAR HCT) 20-12.5 MG tablet Take 1 tablet by mouth daily.     omeprazole (PRILOSEC) 40 MG capsule Take 40 mg by mouth daily.     pilocarpine (PILOCAR) 4 % ophthalmic solution Place 1 drop into both eyes 3 (three) times daily.      PROAIR HFA 108 (90 Base) MCG/ACT inhaler Inhale into the lungs.     sotalol (BETAPACE) 160 MG tablet TAKE 1 TABLET TWICE A DAY 180 tablet 3   traMADol (ULTRAM) 50 MG tablet Take 50 mg by mouth daily as needed for moderate pain or severe pain.      warfarin (COUMADIN) 5 MG tablet TAKE 1 TABLET ON MONDAY, WEDNESDAY, AND FRIDAY. TAKE ONE-HALF (1/2) TABLET (2.5 MG) ALL OTHER DAYS 90 tablet 3   zolpidem (AMBIEN) 10 MG tablet Take 5 mg by mouth at bedtime as needed for sleep.     No current facility-administered medications on file prior to visit.     Cardiac Studies:   Nuclear stress test  [2015/09/30]:  1. Resting EKG demonstrated normal sinus rhythm, normal axis. No evidence of ischemia. Stress EKG is positive for myocardial ischemia. With exercise there is frequent PVCs, ventricular couplets and ventricular complaints, associated with 2 mm horizontal ST segment depression in the inferior and lateral leads. ST segment changes persisted for greater than 3 minutes into recovery. Stress terminated due to fatigue. The patient performed treadmill  exercise using a Bruce protocol, completing 6 minutes. The patient completed an estimated workload of 7.05 METS. 2. Myocardial perfusion imaging is normal. Overall left ventricular systolic function was normal without regional wall motion abnormalities. The left ventricular ejection fraction was 89%. This is an intermediate risk study, clinical correlation recommended.This is an intermediate risk study due to arrhythmias and ST changes of ischemia. Multivessel disease and balanced ischemia cannot be excluded. Consider further cardiac work-up.  Coronary Angiogram  [10/16/2015]: Normal coronary arteries, mild coronary calcification. 3 plus MR. Mild pulmonary hypertension with right heart suggestive of moderate to severe MR. Preserved CO/CI by Fick.  MV repair in [place] 11/26/2015: Complex MV repair with neo-chordal reconstruction anterior and posterior leaflets with 24 CG ring and ligation of left atrial appendage with 45 Atriclip device.  PCV ECHOCARDIOGRAM COMPLETE 70/78/6754 Normal LV systolic function with visual EF 50-55%. Unable to evaluate diastolic function due to mitral valve repair. Left ventricle cavity is normal in size. Normal global wall motion. Mild (Grade I) aortic regurgitation. S/P mitral valvuloplasty well seated without evidence of dehiscence.  Mild mitral stenosis. Mild (Grade I) mitral regurgitation. Mild tricuspid regurgitation. Compared to study dated 11/26/2020 no significant change.  Carotid artery duplex 06/09/2020: Duplex suggests stenosis in the right internal carotid artery (50-69%). Duplex suggests stenosis in the left internal carotid artery (50-69%). Bilateral tortuosity of the ICA.  Antegrade right vertebral artery flow. Antegrade left vertebral artery flow. No significant change since 10/21/2020. Follow up in six months is appropriate if clinically indicated.    EKG:   EKG 06/19/2021: Normal sinus rhythm at rate of 71 bpm, normal axis, incomplete right bundle  branch block.  Frequent PVCs (3).  No significant change from 12/12/2020.   Assessment     ICD-10-CM   1. Asymptomatic carotid artery stenosis  without infarction, bilateral  I65.23 PCV CAROTID DUPLEX (BILATERAL)    2. Paroxysmal atrial fibrillation (HCC)  I48.0 EKG 12-Lead    3. S/P mitral valve repair  Z98.890     4. Stage 3a chronic kidney disease (HCC)  N18.31     5. Primary hypertension  G99 Basic metabolic panel     No orders of the defined types were placed in this encounter.   There are no discontinued medications.   Orders Placed This Encounter  Procedures   Basic metabolic panel   EKG 24-QAST   Recommendations:   Ms. Jessina Marse is a 71 y.o. Caucasian female with MVP and S/P MV repair along with LAA ligation on 11/26/2015 at [facility], [place], [place] and paroxysmal A Fib maintains sinus on Sotalol presents for follow-up.  Past medical history significant for hypertension, hyperlipidemia, asymptomatic bilateral carotid artery stenosis and chronic diastolic heart failure.  Since being on furosemide 10 mg every other day she has not had any further heart failure exacerbations. She is maintaining sinus rhythm.  She is otherwise tolerating anticoagulation for paroxysmal atrial fibrillation.  I will see her back in 6 months.  I reviewed her external labs, lipids are under excellent control, carotid stenosis has remained stable, will recheck carotid duplex in 6 months.  On review of external records, her renal function has slightly deteriorated.  I would like to recheck this and confirm this, BMP orders placed. Unless problems, I will see her back in 6 months for follow-up.    Adrian Prows, MD, [facility] 06/19/2021, 2:29 PM Office: [phone] Pager: [phone]

## 2021-06-27 LAB — BASIC METABOLIC PANEL
BUN/Creatinine Ratio: 14 (ref 12–28)
BUN: 14 mg/dL (ref 8–27)
CO2: 25 mmol/L (ref 20–29)
Calcium: 9.1 mg/dL (ref 8.7–10.3)
Chloride: 97 mmol/L (ref 96–106)
Creatinine, Ser: 1 mg/dL (ref 0.57–1.00)
Glucose: 89 mg/dL (ref 70–99)
Potassium: 4.4 mmol/L (ref 3.5–5.2)
Sodium: 135 mmol/L (ref 134–144)
eGFR: 60 mL/min/{1.73_m2} (ref >59)

## 2021-06-27 NOTE — Progress Notes (Signed)
Labs 04/30/2021:  Sodium 133, potassium 3.9, BUN 14, creatinine 1.17, EGFR 50 mL,  Present test, normal renal function continue present meds

## 2021-06-29 ENCOUNTER — Other Ambulatory Visit: Payer: Self-pay | Admitting: Cardiology

## 2021-06-29 NOTE — Progress Notes (Signed)
Called and spoke with patient regarding her recent lab results.

## 2021-08-06 ENCOUNTER — Other Ambulatory Visit: Payer: Self-pay | Admitting: Cardiology

## 2021-10-08 ENCOUNTER — Telehealth: Payer: Self-pay | Admitting: Cardiology

## 2021-10-08 NOTE — Telephone Encounter (Signed)
Patient says she's having a tooth pulled but will need to be off of warfarin. She would like to know how long she should refrain from taking this medication.

## 2021-10-08 NOTE — Telephone Encounter (Signed)
Most dentists pull teeth on Warfarin, however if this needs held, hold for 5 days and restart previous dose same day as tooth pull.

## 2021-10-09 NOTE — Telephone Encounter (Signed)
Called and spoke with patient regarding her question about stopping Warfarin for dentist visit.

## 2021-11-20 ENCOUNTER — Other Ambulatory Visit: Payer: Self-pay | Admitting: Cardiology

## 2021-11-20 NOTE — Telephone Encounter (Signed)
Refill request

## 2021-11-24 ENCOUNTER — Other Ambulatory Visit: Payer: Self-pay | Admitting: Cardiology

## 2021-11-24 DIAGNOSIS — I10 Essential (primary) hypertension: Secondary | ICD-10-CM

## 2021-12-17 ENCOUNTER — Other Ambulatory Visit: Payer: TRICARE For Life (TFL)

## 2021-12-23 ENCOUNTER — Other Ambulatory Visit: Payer: Self-pay

## 2021-12-23 ENCOUNTER — Encounter: Payer: Self-pay | Admitting: Cardiology

## 2021-12-23 ENCOUNTER — Ambulatory Visit: Payer: Medicare Other | Admitting: Cardiology

## 2021-12-23 VITALS — BP 152/65 | Temp 97.6°F | Resp 16 | Ht 64.0 in | Wt 103.0 lb

## 2021-12-23 DIAGNOSIS — I5032 Chronic diastolic (congestive) heart failure: Secondary | ICD-10-CM

## 2021-12-23 DIAGNOSIS — I6523 Occlusion and stenosis of bilateral carotid arteries: Secondary | ICD-10-CM

## 2021-12-23 DIAGNOSIS — I480 Paroxysmal atrial fibrillation: Secondary | ICD-10-CM

## 2021-12-23 DIAGNOSIS — Z9889 Other specified postprocedural states: Secondary | ICD-10-CM

## 2021-12-23 DIAGNOSIS — E871 Hypo-osmolality and hyponatremia: Secondary | ICD-10-CM

## 2021-12-23 DIAGNOSIS — I10 Essential (primary) hypertension: Secondary | ICD-10-CM

## 2021-12-23 MED ORDER — WARFARIN SODIUM 5 MG PO TABS: ORAL_TABLET | ORAL | 3 refills | Status: DC

## 2021-12-23 MED ORDER — METOPROLOL SUCCINATE ER 100 MG PO TB24: 100.0000 mg | ORAL_TABLET | Freq: Every day | ORAL | 3 refills | Status: DC

## 2021-12-23 NOTE — Progress Notes (Signed)
Primary Physician/Referring:  Aletha Halim., PA-C  Patient ID: Laura Knapp, female    DOB: [birth year], 71 y.o.   MRN: [id number]  Chief Complaint  Patient presents with   mitral valve repair   carotid stenosis   Follow-up    6 month   HPI:    Laura Knapp  is a 71 y.o. Caucasian female with MVP and S/P MV repair along with LAA ligation on 11/26/2015 at [facility], [place], [place] and paroxysmal A Fib maintains sinus on Sotalol presents for follow-up.  Past medical history significant for hypertension, hyperlipidemia, asymptomatic bilateral carotid artery stenosis and chronic diastolic heart failure.  Patient presents here for a 33-monthoffice visit and follow-up of carotid artery stenosis, paroxysmal atrial fibrillation and hypertension.  Tolerating anticoagulation without bleeding diathesis.  She complains of sharp left-sided chest pain in the left parasternal region localized occasionally when she is especially busy working in the yard.  He is tolerating all medications well  She has not had any further leg edema in spite of stopping furosemide due to hyponatremia as recommended by PCP a month ago.  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   Cardiovascular:  Negative for chest pain, dyspnea on exertion and leg swelling.  Gastrointestinal:  Negative for melena.   Objective      12/23/2021    1:08 PM 06/19/2021    1:48 PM 12/17/2020    1:24 PM  Vitals with BMI  Height '5\' 4"'  '5\' 4"'    Weight 103 lbs 102 lbs   BMI 184.13124.4  Systolic [id number] Diastolic 65 89 75  Pulse  66 53    Blood pressure (!) 152/65, temperature 97.6 F (36.4 C), resp. rate 16, height '5\' 4"'  (1.626 m), weight 103 lb (46.7 kg). Body mass index is 17.68 kg/m.   Physical Exam Constitutional:      Comments: She is petite, no acute distress.  HENT:     Head: Atraumatic.  Neck:     Thyroid: No thyromegaly.     Vascular: No carotid bruit or JVD.  Cardiovascular:     Rate and Rhythm: Normal rate and regular rhythm. Occasional Extrasystoles are present.    Pulses:  Normal pulses and intact distal pulses.     Heart sounds: S1 normal and S2 normal. Murmur heard.     Midsystolic murmur is present with a grade of 3/6 at the apex.     Middiastolic murmur is present with a grade of 2/4 at the apex.     No gallop.  Pulmonary:     Effort: Pulmonary effort is normal.     Breath sounds: Rales (scattered, diffuse) present.  Abdominal:     General: Bowel sounds are normal.     Palpations: Abdomen is soft.  Musculoskeletal:     Right lower leg: No edema.     Left lower leg: No edema.  Skin:    Capillary Refill: Capillary refill takes less than 2 seconds.    Radiology:   No results found.  Laboratory examination:    External labs:  Labs 12/03/2021:  Sodium 128, potassium 5.0, BUN 15, creatinine 1.03, EGFR 58 mL.  TSH normal at 1.91.  Vitamin D 42.  Hb 12.8/HCT 38.0, platelets 228.  Labs 04/30/2021:  Sodium 133,  potassium 3.9, BUN 14, creatinine 1.17, EGFR 50 mL, LFTs normal.  Compared to 10/22/2020, creatinine 0.90 and EGFR was normal.  Total cholesterol 120, triglycerides 52, HDL 61, LDL 40.  Hb 12.3/HCT 37.2, platelets 207, normal indicis.  Medications and allergies   Allergies  Allergen Reactions   Acetazolamide Rash    Rash on lower extremities coupled with sudden numbness in fingers and toes - appeared after two doses   Amlodipine Other (See Comments)    Fatigue   Codeine Itching   Lisinopril Cough     Current Outpatient Medications:    acetaminophen (TYLENOL) 325 MG tablet, Take 325 mg by mouth daily as needed for moderate pain. , Disp: , Rfl:    atorvastatin (LIPITOR) 40 MG tablet, TAKE 1 TABLET DAILY, Disp: 90 tablet, Rfl: 3   brimonidine (ALPHAGAN) 0.2 % ophthalmic solution, Place 1 drop into both eyes 3 (three) times daily. , Disp: , Rfl:    cetirizine (ZYRTEC) 10 MG tablet, Take one pill per day, Disp: , Rfl:    cyclobenzaprine (FLEXERIL) 10 MG tablet, Take 5-10 mg by mouth daily as needed for muscle spasms. , Disp: , Rfl:    dorzolamide-timolol (COSOPT) 22.3-6.8 MG/ML ophthalmic solution, Place 1 drop into both eyes 2 (two) times daily., Disp: , Rfl:    ENSURE (ENSURE), Take 237 mLs by mouth every other day. Chocolate, Disp: , Rfl:    ezetimibe (ZETIA) 10 MG tablet, TAKE 1 TABLET IN THE EVENING AFTER DINNER, Disp: 90 tablet, Rfl: 3   fluticasone (FLONASE) 50 MCG/ACT nasal spray, Place 2 sprays into both nostrils 2 (two) times daily., Disp: , Rfl:    hydrALAZINE (APRESOLINE) 50 MG tablet, TAKE 1 TABLET THREE TIMES A DAY, Disp: 270 tablet, Rfl: 3   latanoprost (XALATAN) 0.005 % ophthalmic solution, Place 1 drop into both eyes at bedtime., Disp: , Rfl:    latanoprost (XALATAN) 0.005 % ophthalmic solution, Place 1 drop into both eyes nightly., Disp: , Rfl:    metoprolol succinate (TOPROL-XL) 50 MG 24 hr tablet, TAKE 1 TABLET DAILY. TAKE WITH OR IMMEDIATELY FOLLOWING A MEAL, Disp: 90  tablet, Rfl: 3   olmesartan-hydrochlorothiazide (BENICAR HCT) 20-12.5 MG tablet, Take 1 tablet by mouth daily., Disp: , Rfl:    omeprazole (PRILOSEC) 40 MG capsule, Take 40 mg by mouth daily., Disp: , Rfl:    pilocarpine (PILOCAR) 4 % ophthalmic solution, Place 1 drop into both eyes  3 (three) times daily. , Disp: , Rfl:    PROAIR HFA 108 (90 Base) MCG/ACT inhaler, Inhale into the lungs., Disp: , Rfl:    sotalol (BETAPACE) 160 MG tablet, TAKE 1 TABLET TWICE A DAY, Disp: 180 tablet, Rfl: 3   traMADol (ULTRAM) 50 MG tablet, Take 50 mg by mouth daily as needed for moderate pain or severe pain. , Disp: , Rfl:    warfarin (COUMADIN) 5 MG tablet, TAKE 1 TABLET ON MONDAY, WEDNESDAY, AND FRIDAY. TAKE ONE-HALF (1/2) TABLET (2.5 MG) ALL OTHER DAYS, Disp: 90 tablet, Rfl: 3   zolpidem (AMBIEN) 10 MG tablet, Take 5 mg by mouth at bedtime as needed for sleep., Disp: , Rfl:      Cardiac Studies:   Nuclear stress test  [10-11-2015]:  1. Resting EKG demonstrated normal sinus rhythm, normal axis. No evidence of ischemia. Stress EKG is positive for myocardial ischemia. With exercise there is frequent PVCs, ventricular couplets and ventricular complaints, associated with 2 mm horizontal ST segment depression in the inferior and lateral leads. ST segment changes persisted for greater than 3 minutes into recovery. Stress terminated due to fatigue. The patient performed treadmill exercise using a Bruce protocol, completing 6 minutes. The patient completed an estimated workload of 7.05 METS. 2. Myocardial perfusion imaging is normal. Overall left ventricular systolic function was normal without regional wall motion abnormalities. The left ventricular ejection fraction was 89%. This is an intermediate risk study, clinical correlation recommended.This is an intermediate risk study due to arrhythmias and ST changes of ischemia. Multivessel disease and balanced ischemia cannot be excluded. Consider further cardiac  work-up.  Coronary Angiogram  [10/16/2015]: Normal coronary arteries, mild coronary calcification. 3 plus MR. Mild pulmonary hypertension with right heart suggestive of moderate to severe MR. Preserved CO/CI by Fick.  MV repair in [place] 11/26/2015: Complex MV repair with neo-chordal reconstruction anterior and posterior leaflets with 24 CG ring and ligation of left atrial appendage with 45 Atriclip device.  PCV ECHOCARDIOGRAM COMPLETE 93/71/6967 Normal LV systolic function with visual EF 50-55%. Unable to evaluate diastolic function due to mitral valve repair. Left ventricle cavity is normal in size. Normal global wall motion. Mild (Grade I) aortic regurgitation. S/P mitral valvuloplasty well seated without evidence of dehiscence.  Mild mitral stenosis. Mild (Grade I) mitral regurgitation. Mild tricuspid regurgitation. Compared to study dated 11/26/2020 no significant change.  Carotid artery duplex 06/09/2021: Duplex suggests stenosis in the right internal carotid artery (50-69%). Duplex suggests stenosis in the left internal carotid artery (50-69%). Bilateral tortuosity of the ICA.  Antegrade right vertebral artery flow. Antegrade left vertebral artery flow. No significant change since 10/21/2020. Follow up in six months is appropriate if clinically indicated.    EKG:   EKG 12/23/2021: Normal sinus rhythm at rate of 65 bpm, normal axis.  Incomplete right bundle branch block.  No evidence of ischemia, frequent PACs.  Low-voltage complexes, consider pulmonary disease pattern.  Compared to 06/19/2021, frequent PVCs no longer noted.   Assessment     ICD-10-CM   1. S/P mitral valve repair  Z98.890 EKG 12-Lead    2. Primary hypertension  I10     3. Paroxysmal atrial fibrillation (HCC)  I48.0     4. Asymptomatic carotid artery stenosis without infarction, bilateral  I65.23      No orders of the defined types were placed in this encounter.   Medications Discontinued During This  Encounter  Medication Reason   furosemide (LASIX) 20 MG tablet      Orders  Placed This Encounter  Procedures   EKG 12-Lead   Recommendations:   Ms. Laura Knapp is a 71 y.o. Caucasian female with MVP and S/P MV repair along with LAA ligation on 11/26/2015 at [facility], [place], [place] and paroxysmal A Fib maintains sinus on Sotalol presents for follow-up.  Past medical history significant for hypertension, hyperlipidemia, asymptomatic bilateral carotid artery stenosis and chronic diastolic heart failure.  Patient presents here for a 16-monthoffice visit and follow-up of carotid artery stenosis, paroxysmal atrial fibrillation and hypertension.  I reviewed external labs, she has developed hyponatremia and furosemide has been discontinued.  I would like to recheck her sodium levels, will also obtain BNP for evaluation of heart failure.  Blood pressure is elevated today, will increase metoprolol succinate from 50 g to 100 mg daily.  She is also on sotalol for paroxysmal atrial fibrillation, she has frequent PACs noted, she is clearly at risk for developing recurrence of atrial fibrillation as well.  No change in her mitral stenotic murmur, no clinical evidence of heart failure, no leg edema.  She is tolerating anticoagulation with warfarin without bleeding diathesis.  Would like to see her back in 4 to 6 weeks for follow-up of hyponatremia, hypertension and I will repeat EKG at that time.  She also needs carotid artery surveillance.  Lipids reviewed, LDL is at goal.    JAdrian Prows MD, [facility]8/06/2021, 1:23 PM Office: [phone]: [phone]

## 2022-01-05 LAB — BASIC METABOLIC PANEL
BUN/Creatinine Ratio: 14 (ref 12–28)
BUN: 14 mg/dL (ref 8–27)
CO2: 22 mmol/L (ref 20–29)
Calcium: 9.3 mg/dL (ref 8.7–10.3)
Chloride: 95 mmol/L — ABNORMAL LOW (ref 96–106)
Creatinine, Ser: 0.98 mg/dL (ref 0.57–1.00)
Glucose: 92 mg/dL (ref 70–99)
Potassium: 4.7 mmol/L (ref 3.5–5.2)
Sodium: 129 mmol/L — ABNORMAL LOW (ref 134–144)
eGFR: 61 mL/min/{1.73_m2} (ref >59)

## 2022-01-05 LAB — BRAIN NATRIURETIC PEPTIDE: BNP: 193 pg/mL — ABNORMAL HIGH (ref 0.0–100.0)

## 2022-01-05 LAB — MAGNESIUM: Magnesium: 2 mg/dL (ref 1.6–2.3)

## 2022-01-05 MED ORDER — ISOSORBIDE DINITRATE 30 MG PO TABS: 30.0000 mg | ORAL_TABLET | Freq: Three times a day (TID) | ORAL | 2 refills | Status: DC

## 2022-01-05 NOTE — Progress Notes (Signed)
Please advise her to stop Olmesartan HCT 20/12.5 mg as her sodium  continues to be very low. I will add isosorbide dinitrate that she will take three times a day for BP and heart. She will need repeat blood work 2 days prior to her visit with me 02/03/2022

## 2022-01-05 NOTE — Addendum Note (Signed)
Addended by: Kela Millin on: 01/05/2022 10:34 PM   Modules accepted: Orders

## 2022-01-06 NOTE — Progress Notes (Signed)
Advised patient of message above, patient expressed an understanding  and did not have any questions at this time.

## 2022-01-06 NOTE — Addendum Note (Signed)
Addended by: Kela Millin on: 01/06/2022 11:17 PM   Modules accepted: Orders

## 2022-01-07 ENCOUNTER — Ambulatory Visit: Payer: Medicare Other

## 2022-01-07 DIAGNOSIS — I6523 Occlusion and stenosis of bilateral carotid arteries: Secondary | ICD-10-CM

## 2022-01-12 NOTE — Progress Notes (Signed)
Bilateral ICA 50-69% stenosis stable since 07/27/2019, we could consider changing surveillance to every year, discuss on OV soon

## 2022-02-03 ENCOUNTER — Encounter: Payer: Self-pay | Admitting: Cardiology

## 2022-02-03 ENCOUNTER — Ambulatory Visit: Payer: Medicare Other | Admitting: Cardiology

## 2022-02-03 VITALS — BP 179/87 | HR 52 | Temp 98.5°F | Resp 16 | Ht 62.0 in | Wt 101.2 lb

## 2022-02-03 DIAGNOSIS — I480 Paroxysmal atrial fibrillation: Secondary | ICD-10-CM

## 2022-02-03 DIAGNOSIS — Z9889 Other specified postprocedural states: Secondary | ICD-10-CM

## 2022-02-03 DIAGNOSIS — I5032 Chronic diastolic (congestive) heart failure: Secondary | ICD-10-CM

## 2022-02-03 DIAGNOSIS — I10 Essential (primary) hypertension: Secondary | ICD-10-CM

## 2022-02-03 MED ORDER — ISOSORBIDE DINITRATE 30 MG PO TABS: 30.0000 mg | ORAL_TABLET | Freq: Three times a day (TID) | ORAL | 2 refills | Status: DC

## 2022-02-03 MED ORDER — AMLODIPINE BESYLATE 5 MG PO TABS: 5.0000 mg | ORAL_TABLET | Freq: Every evening | ORAL | 2 refills | Status: DC

## 2022-02-03 NOTE — Progress Notes (Signed)
Primary Physician/Referring:  Aletha Halim., PA-C  Patient ID: Laura Knapp, female    DOB: 1950/10/01, 71 y.o.   MRN: [id number]  Chief Complaint  Patient presents with   Hypertension   hyponatremia   Follow-up    With repeat EKG    HPI:    Laura Knapp  is a 71 y.o.Caucasian female with MVP and S/P MV repair along with LAA ligation on 11/26/2015 at [facility], [place], [place] and paroxysmal A Fib maintains sinus on Sotalol presents for follow-up.  Past medical history significant for hypertension, hyperlipidemia, asymptomatic bilateral carotid artery stenosis, chronic bilateral lower extremity edema and chronic diastolic heart failure.  I seen her about 4 to 6 weeks ago, she developed severe hyponatremia.  I discontinued furosemide as clinically she was not in acute decompensated heart failure and repeated the BMP which again revealed severe hyponatremia and it appears to be on and off and mostly chronic as well.  She has not had any further leg edema in spite of stopping furosemide due to hyponatremia as recommended by PCP a month ago. No specific complaints. I am bringing her back to evaluate elevated BP, hyponatremia and mitral valve disease.   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   Cardiovascular:  Negative for chest pain, dyspnea on exertion and leg swelling.  Gastrointestinal:  Negative for melena.   Objective      02/03/2022    1:39 PM 12/23/2021    1:08 PM 06/19/2021    1:48 PM  Vitals with BMI  Height _0  _1  _2   Weight 101 lbs 3 oz 103 lbs 102 lbs  BMI 18.51 46.56 81.2  Systolic 751 700 174  Diastolic 87 65 89  Pulse 52  66    Blood pressure (!) 179/87, pulse (!) 52, temperature 98.5 F (36.9 C), temperature source Temporal, resp. rate 16, height _3  (1.575 m), weight 101 lb 3.2 oz (45.9 kg), SpO2 93 %. Body mass index is 18.51 kg/m.   Physical Exam Constitutional:      Comments: She is petite, no acute distress.  HENT:     Head: Atraumatic.  Neck:     Thyroid: No thyromegaly.  Vascular: No carotid bruit or JVD.  Cardiovascular:     Rate and Rhythm: Normal rate and regular rhythm. Occasional Extrasystoles are present.    Pulses: Normal pulses and intact distal pulses.     Heart sounds: S1 normal and S2 normal. Murmur heard.     Midsystolic murmur is present with a grade of 3/6 at the apex.     Middiastolic murmur is present with a grade of 2/4 at the apex.     No gallop.  Pulmonary:     Effort: Pulmonary effort is normal.     Breath sounds: Rales (scattered, diffuse) present.  Abdominal:     General: Bowel sounds are normal.     Palpations: Abdomen is soft.  Musculoskeletal:     Right lower leg: No edema.     Left lower leg: No edema.  Skin:    Capillary Refill: Capillary refill takes less than 2 seconds.    Radiology:   No results found.  Laboratory examination:    Lab Results  Component Value Date   NA 129 (L) 01/04/2022   K 4.7 01/04/2022   CO2  22 01/04/2022   GLUCOSE 92 01/04/2022   BUN 14 01/04/2022   CREATININE 0.98 01/04/2022   CALCIUM 9.3 01/04/2022   EGFR 61 01/04/2022   GFRNONAA 52 (L) 03/18/2020       Latest Ref Rng & Units 01/04/2022   10:16 AM 06/26/2021   11:46 AM 03/18/2020    9:34 AM  CMP  Glucose 70 - 99 mg/dL 92  89  90   BUN 8 - 27 mg/dL _0 Creatinine 0.57 - 1.00 mg/dL 0.98  1.00  1.09   Sodium 134 - 144 mmol/L 129  135  130   Potassium 3.5 - 5.2 mmol/L 4.7  4.4  4.8   Chloride 96 - 106 mmol/L 95  97  93   CO2 20 - 29 mmol/L _1 Calcium 8.7 - 10.3 mg/dL 9.3  9.1  9.6    BNP (last 3 results) Recent Labs    01/04/22 1016  BNP 193.0*   External labs:  Labs 12/03/2021:  Sodium 128, potassium 5.0, BUN 15, creatinine 1.03, EGFR 58 mL.  TSH normal at 1.91.  Vitamin D 42.  Hb 12.8/HCT 38.0, platelets 228.  Labs 04/30/2021:  Sodium 133, potassium 3.9, BUN 14, creatinine 1.17, EGFR 50 mL, LFTs normal.  Compared to 10/22/2020, creatinine 0.90 and EGFR was normal.  Total cholesterol 120, triglycerides 52, HDL 61, LDL 40.  Hb 12.3/HCT 37.2, platelets 207, normal indicis.  Medications and allergies   Allergies  Allergen Reactions   Acetazolamide Rash    Rash on lower extremities coupled with sudden numbness in fingers and toes - appeared after two doses   Amlodipine Other (See Comments)    Fatigue   Codeine Itching   Lisinopril Cough     Current Outpatient Medications:    acetaminophen (TYLENOL) 325 MG tablet, Take 325 mg by mouth daily as needed for moderate pain. , Disp: , Rfl:    amLODipine (NORVASC) 5 MG tablet, Take 1 tablet (5 mg total) by mouth every evening., Disp: 30 tablet, Rfl: 2   atorvastatin (LIPITOR) 40 MG tablet, TAKE 1 TABLET DAILY, Disp: 90 tablet, Rfl: 3   brimonidine (ALPHAGAN) 0.2 % ophthalmic solution, Place 1 drop into both eyes 3 (three) times daily. , Disp: , Rfl:    cetirizine (ZYRTEC) 10 MG tablet, Take  one pill per day, Disp: , Rfl:    cyclobenzaprine  (FLEXERIL) 10 MG tablet, Take 5-10 mg by mouth daily as needed for muscle spasms. , Disp: , Rfl:    dorzolamide-timolol (COSOPT) 22.3-6.8 MG/ML ophthalmic solution, Place 1 drop into both eyes 2 (two) times daily., Disp: , Rfl:    ENSURE (ENSURE), Take 237 mLs by mouth every other day. Chocolate, Disp: , Rfl:    ezetimibe (ZETIA) 10 MG tablet, TAKE 1 TABLET IN THE EVENING AFTER DINNER, Disp: 90 tablet, Rfl: 3   fluticasone (FLONASE) 50 MCG/ACT nasal spray, Place 2 sprays into both nostrils 2 (two) times daily., Disp: , Rfl:    hydrALAZINE (APRESOLINE) 50 MG tablet, TAKE 1 TABLET THREE TIMES A DAY, Disp: 270 tablet, Rfl: 3   latanoprost (XALATAN) 0.005 % ophthalmic solution, Place 1 drop into both eyes at bedtime., Disp: , Rfl:    metoprolol succinate (TOPROL-XL) 100 MG 24 hr tablet, Take 1 tablet (100 mg total) by mouth daily. Take with or immediately following a meal., Disp: 90 tablet, Rfl: 3   omeprazole (PRILOSEC) 40 MG capsule, Take 40 mg by mouth daily., Disp: , Rfl:    pilocarpine (PILOCAR) 4 % ophthalmic solution, Place 1 drop into both eyes 3 (three) times daily. , Disp: , Rfl:    PROAIR HFA 108 (90 Base) MCG/ACT inhaler, Inhale into the lungs., Disp: , Rfl:    sotalol (BETAPACE) 160 MG tablet, TAKE 1 TABLET TWICE A DAY, Disp: 180 tablet, Rfl: 3   traMADol (ULTRAM) 50 MG tablet, Take 50 mg by mouth daily as needed for moderate pain or severe pain. , Disp: , Rfl:    warfarin (COUMADIN) 5 MG tablet, TAKE 1 TABLET ON MONDAY, WEDNESDAY, AND FRIDAY. TAKE ONE-HALF (1/2) TABLET (2.5 MG) ALL OTHER DAYS, Disp: 90 tablet, Rfl: 3   zolpidem (AMBIEN) 10 MG tablet, Take 5 mg by mouth at bedtime as needed for sleep., Disp: , Rfl:    isosorbide dinitrate (ISORDIL) 30 MG tablet, Take 1 tablet (30 mg total) by mouth 3 (three) times daily., Disp: 90 tablet, Rfl: 2     Cardiac Studies:   Nuclear stress test  [2015/10/12]:  1. Resting EKG demonstrated normal sinus rhythm, normal axis. No evidence of  ischemia. Stress EKG is positive for myocardial ischemia. With exercise there is frequent PVCs, ventricular couplets and ventricular complaints, associated with 2 mm horizontal ST segment depression in the inferior and lateral leads. ST segment changes persisted for greater than 3 minutes into recovery. Stress terminated due to fatigue. The patient performed treadmill exercise using a Bruce protocol, completing 6 minutes. The patient completed an estimated workload of 7.05 METS. 2. Myocardial perfusion imaging is normal. Overall left ventricular systolic function was normal without regional wall motion abnormalities. The left ventricular ejection fraction was 89%. This is an intermediate risk study, clinical correlation recommended.This is an intermediate risk study due to arrhythmias and ST changes of ischemia. Multivessel disease and balanced ischemia cannot be excluded. Consider further cardiac work-up.  Coronary Angiogram  [10/16/2015]: Normal coronary arteries, mild coronary calcification. 3 plus MR. Mild pulmonary hypertension with right heart suggestive of moderate to severe MR. Preserved CO/CI by Fick.  MV repair in [place] 11/26/2015: Complex MV repair with neo-chordal reconstruction anterior and posterior leaflets with 24 CG ring and ligation of left atrial appendage with 45 Atriclip device.  PCV ECHOCARDIOGRAM COMPLETE 45/36/4680 Normal LV systolic function with visual EF 50-55%. Unable to evaluate diastolic function due to mitral valve repair. Left  ventricle cavity is normal in size. Normal global wall motion. Mild (Grade I) aortic regurgitation. S/P mitral valvuloplasty well seated without evidence of dehiscence.  Mild mitral stenosis. Mild (Grade I) mitral regurgitation. Mild tricuspid regurgitation. Compared to study dated 11/26/2020 no significant change.  Carotid artery duplex 01/07/2022: Duplex suggests stenosis in the right internal carotid artery (50-69%). Duplex suggests  stenosis in the left internal carotid artery (50-69%). Bilateral tortuosity of the ICA.  Antegrade right vertebral artery flow. Antegrade left vertebral artery flow. No significant change from 06/09/2021. Follow up in six months is appropriate if clinically indicated.    EKG:   EKG 02/03/2022: Sinus bradycardia at rate of 57 bpm with sinus arrhythmia, occasional PACs.  Normal axis, incomplete right bundle branch block.  No evidence of ischemia, normal QT interval.  No significant change from 12/23/2021.  Assessment     ICD-10-CM   1. Primary hypertension  I10 isosorbide dinitrate (ISORDIL) 30 MG tablet    2. Chronic diastolic heart failure (HCC)  I50.32 isosorbide dinitrate (ISORDIL) 30 MG tablet    PCV ECHOCARDIOGRAM COMPLETE    3. Paroxysmal atrial fibrillation (HCC)  I48.0 EKG 12-Lead    4. S/P mitral valve repair  Z98.890 PCV ECHOCARDIOGRAM COMPLETE     Meds ordered this encounter  Medications   isosorbide dinitrate (ISORDIL) 30 MG tablet    Sig: Take 1 tablet (30 mg total) by mouth 3 (three) times daily.    Dispense:  90 tablet    Refill:  2   amLODipine (NORVASC) 5 MG tablet    Sig: Take 1 tablet (5 mg total) by mouth every evening.    Dispense:  30 tablet    Refill:  2    Medications Discontinued During This Encounter  Medication Reason   latanoprost (XALATAN) 0.005 % ophthalmic solution    olmesartan-hydrochlorothiazide (BENICAR HCT) 20-12.5 MG tablet    isosorbide dinitrate (ISORDIL) 30 MG tablet Reorder     Orders Placed This Encounter  Procedures   EKG 12-Lead   PCV ECHOCARDIOGRAM COMPLETE    Standing Status:   Future    Standing Expiration Date:   02/04/2023   Recommendations:   Ms. Monita Swier is a 71 y.o. Caucasian female with MVP and S/P MV repair along with LAA ligation on 11/26/2015 at [facility], [place], [place] and paroxysmal A Fib maintains sinus on Sotalol presents for follow-up.  Past medical history significant for hypertension, hyperlipidemia, asymptomatic  bilateral carotid artery stenosis, chronic bilateral lower extremity edema and chronic diastolic heart failure.  I seen her about 4 to 6 weeks ago, she developed severe hyponatremia.  I discontinued furosemide as clinically she was not in acute decompensated heart failure and repeated the BMP which again revealed severe hyponatremia and it appears to be on and off and mostly chronic as well.  I have discontinued olmesartan today.  Repeat BMP is pending.  Although no clinical evidence of heart failure, I am concerned about ongoing hyponatremia and mildly elevated BNP.  She does have mild to moderate mitral stenosis and mild mitral regurgitation.  Since mitral valve repair, I will repeat echocardiogram.  I would like to see her back in 6 weeks for follow-up with BMP.  With regard to hypertension, markedly elevated blood pressure, although isosorbide dinitrate prescription was sent.  She never received it, hence prescription was resent.  I will also start her on amlodipine 5 mg in the evening, previously she had not tolerated this, stating that it caused fatigue.  If she tolerates this,  we could certainly increase it to 10 mg daily.  We will follow-up on hypertension and also echocardiogram on her next office visit.  Carotid artery duplex reveals stable mild carotid stenosis.  We will continue routine surveillance.   Adrian Prows, MD, [facility] 02/06/2022, 3:37 PM Office: [phone] Pager: [phone]

## 2022-02-06 ENCOUNTER — Encounter: Payer: Self-pay | Admitting: Cardiology

## 2022-03-04 ENCOUNTER — Other Ambulatory Visit: Payer: TRICARE For Life (TFL)

## 2022-03-17 ENCOUNTER — Ambulatory Visit: Payer: Medicare Other

## 2022-03-17 DIAGNOSIS — Z9889 Other specified postprocedural states: Secondary | ICD-10-CM

## 2022-03-17 DIAGNOSIS — I5032 Chronic diastolic (congestive) heart failure: Secondary | ICD-10-CM

## 2022-03-22 ENCOUNTER — Ambulatory Visit: Payer: Medicare Other | Admitting: Cardiology

## 2022-03-22 ENCOUNTER — Encounter: Payer: Self-pay | Admitting: Cardiology

## 2022-03-22 VITALS — BP 132/75 | HR 56 | Temp 97.9°F | Resp 16 | Ht 62.0 in | Wt 99.2 lb

## 2022-03-22 DIAGNOSIS — E871 Hypo-osmolality and hyponatremia: Secondary | ICD-10-CM

## 2022-03-22 DIAGNOSIS — Z9889 Other specified postprocedural states: Secondary | ICD-10-CM

## 2022-03-22 DIAGNOSIS — I6523 Occlusion and stenosis of bilateral carotid arteries: Secondary | ICD-10-CM

## 2022-03-22 DIAGNOSIS — I10 Essential (primary) hypertension: Secondary | ICD-10-CM

## 2022-03-22 MED ORDER — ISOSORBIDE DINITRATE 30 MG PO TABS: 30.0000 mg | ORAL_TABLET | Freq: Three times a day (TID) | ORAL | 3 refills | Status: AC

## 2022-03-22 MED ORDER — AMLODIPINE BESYLATE 5 MG PO TABS: 5.0000 mg | ORAL_TABLET | Freq: Every evening | ORAL | 3 refills | Status: AC

## 2022-03-22 NOTE — Progress Notes (Unsigned)
Primary Physician/Referring:  Aletha Halim., PA-C  Patient ID: Laura Knapp, female    DOB: 07-Feb-1951, 71 y.o.   MRN: [id number]  No chief complaint on file.  HPI:    Laura Knapp  is a 71 y.o. Caucasian female with MVP and S/P MV repair along with LAA ligation on 11/26/2015 at [facility], [place], [place] and paroxysmal A Fib maintains sinus on Sotalol, hypertension, hyperlipidemia, asymptomatic bilateral carotid artery stenosis and chronic diastolic heart failure.  I am seeing her frequently due to severe hyponatremia.  Also blood pressure was elevated and medication adjustments were made.  In view of persistent hyponatremia in spite of discontinuing diuretics, on her last office visit 4 to 6 weeks ago I discontinued olmesartan.  She underwent echocardiogram and now presents for follow-up.  No specific complaints today, denies leg edema, denies worsening dyspnea, PND or orthopnea.  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    Cardiovascular:  Negative for chest pain, dyspnea on exertion and leg swelling.  Gastrointestinal:  Negative for melena.   Objective      03/22/2022    2:08 PM 02/03/2022    1:39 PM 12/23/2021    1:08 PM  Vitals with BMI  Height _0  _1  _2   Weight 99 lbs 3 oz 101 lbs 3 oz 103 lbs  BMI 18.14 09.62 83.66  Systolic 294 765 465  Diastolic 75 87 65  Pulse 56 52     Blood pressure 132/75, pulse (!) 56, temperature 97.9 F (36.6 C), temperature source Temporal, resp. rate 16, height _3  (1.575 m), weight 99 lb 3.2 oz (45 kg), SpO2 96 %. Body mass index is 18.14 kg/m.   Physical Exam Constitutional:      Appearance: She is underweight.  Neck:     Vascular: No carotid bruit or JVD.  Cardiovascular:     Rate and Rhythm: Normal rate and regular rhythm.     Pulses: Normal pulses and intact distal pulses.     Heart sounds: S1 normal and S2 normal. Murmur heard.     Midsystolic murmur is present with a grade of 3/6 at the apex.     Middiastolic murmur is present with a grade  of 2/4 at the apex.     No gallop.  Pulmonary:     Effort: Pulmonary effort is normal.     Breath sounds: Rales (scattered, diffuse) present.  Abdominal:     General: Bowel sounds are normal.     Palpations: Abdomen is soft.  Musculoskeletal:     Right lower leg: No edema.     Left lower leg: No edema.  Skin:    Capillary Refill: Capillary refill takes less than 2 seconds.    Radiology:   No results found.  Laboratory examination:    Lab Results  Component Value Date   NA 132 (L) 03/22/2022   K 5.0 03/22/2022   CO2 25 03/22/2022   GLUCOSE 111 (H) 03/22/2022   BUN 17 03/22/2022   CREATININE 1.09 (H) 03/22/2022   CALCIUM 9.1 03/22/2022   EGFR 54 (L) 03/22/2022   GFRNONAA 52 (L) 03/18/2020       Latest Ref Rng & Units 03/22/2022    3:07 PM 01/04/2022   10:16 AM 06/26/2021   11:46 AM  CMP  Glucose 70 - 99 mg/dL 111  92  89   BUN 8 - 27 mg/dL _0 Creatinine 0.57 - 1.00 mg/dL  1.09  0.98  1.00   Sodium 134 - 144 mmol/L 132  129  135   Potassium 3.5 - 5.2 mmol/L 5.0  4.7  4.4   Chloride 96 - 106 mmol/L 95  95  97   CO2 20 - 29 mmol/L _1 Calcium 8.7 - 10.3 mg/dL 9.1  9.3  9.1    BNP (last 3 results) Recent Labs    01/04/22 1016  BNP 193.0*    ProBNP (last 3 results) Recent Labs    03/22/22 1507  PROBNP 1,197*   External labs:  Labs 12/03/2021:  Sodium 128, potassium 5.0, BUN 15, creatinine 1.03, EGFR 58 mL.  TSH normal at 1.91.  Vitamin D 42.  Hb 12.8/HCT 38.0, platelets 228.  Labs 04/30/2021:  Sodium 133, potassium 3.9, BUN 14, creatinine 1.17, EGFR 50 mL, LFTs normal.  Compared to 10/22/2020, creatinine 0.90 and EGFR was normal.  Total cholesterol 120, triglycerides 52, HDL 61, LDL 40.  Hb 12.3/HCT 37.2, platelets 207, normal indicis.  Medications and allergies   Allergies  Allergen Reactions   Acetazolamide Rash    Rash on lower extremities coupled with sudden numbness in fingers and toes - appeared after two doses   Codeine Itching   Lisinopril Cough     Current Outpatient Medications:    acetaminophen (TYLENOL) 325 MG tablet, Take 325 mg by mouth daily as needed for moderate pain. , Disp: , Rfl:    atorvastatin (LIPITOR) 40 MG tablet, TAKE 1 TABLET DAILY, Disp: 90 tablet, Rfl: 3   brimonidine (ALPHAGAN) 0.2 % ophthalmic solution, Place 1 drop into both eyes 3 (three) times daily. , Disp: , Rfl:    cetirizine (ZYRTEC) 10 MG tablet, Take one pill per day, Disp: , Rfl:    cyclobenzaprine (FLEXERIL) 10 MG tablet, Take 5-10 mg by mouth daily as needed for muscle spasms. , Disp: , Rfl:    dorzolamide-timolol (COSOPT) 22.3-6.8 MG/ML ophthalmic solution, Place 1 drop into both eyes 2 (two) times daily., Disp: , Rfl:    ENSURE (ENSURE), Take 237 mLs by mouth every other day. Chocolate, Disp: , Rfl:    ezetimibe (ZETIA) 10 MG tablet, TAKE 1 TABLET IN THE EVENING AFTER DINNER, Disp:  90 tablet, Rfl: 3   fluticasone (FLONASE) 50 MCG/ACT  nasal spray, Place 2 sprays into both nostrils 2 (two) times daily., Disp: , Rfl:    hydrALAZINE (APRESOLINE) 50 MG tablet, TAKE 1 TABLET THREE TIMES A DAY, Disp: 270 tablet, Rfl: 3   latanoprost (XALATAN) 0.005 % ophthalmic solution, Place 1 drop into both eyes at bedtime., Disp: , Rfl:    metoprolol succinate (TOPROL-XL) 100 MG 24 hr tablet, Take 1 tablet (100 mg total) by mouth daily. Take with or immediately following a meal., Disp: 90 tablet, Rfl: 3   omeprazole (PRILOSEC) 40 MG capsule, Take 40 mg by mouth daily., Disp: , Rfl:    pilocarpine (PILOCAR) 4 % ophthalmic solution, Place 1 drop into both eyes 3 (three) times daily. , Disp: , Rfl:    PROAIR HFA 108 (90 Base) MCG/ACT inhaler, Inhale into the lungs., Disp: , Rfl:    sotalol (BETAPACE) 160 MG tablet, TAKE 1 TABLET TWICE A DAY, Disp: 180 tablet, Rfl: 3   traMADol (ULTRAM) 50 MG tablet, Take 50 mg by mouth daily as needed for moderate pain or severe pain. , Disp: , Rfl:    warfarin (COUMADIN) 5 MG tablet, TAKE 1 TABLET ON MONDAY, WEDNESDAY, AND FRIDAY. TAKE ONE-HALF (1/2) TABLET (2.5 MG) ALL OTHER DAYS, Disp: 90 tablet, Rfl: 3   zolpidem (AMBIEN) 10 MG tablet, Take 5 mg by mouth at bedtime as needed for sleep., Disp: , Rfl:    amLODipine (NORVASC) 5 MG tablet, Take 1 tablet (5 mg total) by mouth every evening., Disp: 90 tablet, Rfl: 3   isosorbide dinitrate (ISORDIL) 30 MG tablet, Take 1 tablet (30 mg total) by mouth 3 (three) times daily., Disp: 270 tablet, Rfl: 3     Cardiac Studies:   Nuclear stress test  [10/05/2015]:  1. Resting EKG demonstrated normal sinus rhythm, normal axis. No evidence of ischemia. Stress EKG is positive for myocardial ischemia. With exercise there is frequent PVCs, ventricular couplets and ventricular complaints, associated with 2 mm horizontal ST segment depression in the inferior and lateral leads. ST segment changes persisted for greater than 3 minutes into recovery. Stress terminated due to fatigue. The  patient performed treadmill exercise using a Bruce protocol, completing 6 minutes. The patient completed an estimated workload of 7.05 METS. 2. Myocardial perfusion imaging is normal. Overall left ventricular systolic function was normal without regional wall motion abnormalities. The left ventricular ejection fraction was 89%. This is an intermediate risk study, clinical correlation recommended.This is an intermediate risk study due to arrhythmias and ST changes of ischemia. Multivessel disease and balanced ischemia cannot be excluded. Consider further cardiac work-up.  Coronary Angiogram  [10/16/2015]: Normal coronary arteries, mild coronary calcification. 3 plus MR. Mild pulmonary hypertension with right heart suggestive of moderate to severe MR. Preserved CO/CI by Fick.  MV repair in [place] 11/26/2015: Complex MV repair with neo-chordal reconstruction anterior and posterior leaflets with 24 CG ring and ligation of left atrial appendage with 45 Atriclip device.  Echocardiogram 03/17/2022:  Normal LV systolic function with visual EF 50-55%. Left ventricle cavity is normal in size. Normal global wall motion. Indeterminate diastolic filling pattern. Calculated EF 50%. Left atrial cavity is mildly dilated. Structurally normal trileaflet aortic valve.  Mild (Grade I) aortic regurgitation. Valvuloplasty of the mitral valve.  Mild (Grade I) mitral regurgitation. Mitral valve ring repair of the mitral annulus. E-wave dominant mitral inflow. Structurally normal tricuspid valve.  Mild tricuspid regurgitation. Moderate pulmonary hypertension. RVSP measures 52 mmHg. Compared to 12/2020,  moderate pulmonary hypertension is now present.  Carotid artery duplex 01/07/2022: Duplex suggests stenosis in the right internal carotid artery (50-69%). Duplex suggests stenosis in the left internal carotid artery (50-69%). Bilateral tortuosity of the ICA.  Antegrade right vertebral artery flow. Antegrade left vertebral  artery flow. No significant change from 06/09/2021. Follow up in six months is appropriate if clinically indicated.    EKG:   EKG 02/03/2022: Sinus bradycardia at rate of 57 bpm with sinus arrhythmia, occasional PACs.  Normal axis, incomplete right bundle branch block.  No evidence of ischemia, normal QT interval.  No significant change from 12/23/2021.  Assessment     ICD-10-CM   1. Primary hypertension  I10 isosorbide dinitrate (ISORDIL) 30 MG tablet    amLODipine (NORVASC) 5 MG tablet    2. Hyponatremia  E87.1     3. Asymptomatic carotid artery stenosis without infarction, bilateral  I65.23 PCV CAROTID DUPLEX (BILATERAL)    4. S/P mitral valve repair  Z98.890      Meds ordered this encounter  Medications   isosorbide dinitrate (ISORDIL) 30 MG tablet    Sig: Take 1 tablet (30 mg total) by mouth 3 (three) times daily.    Dispense:  270 tablet    Refill:  3   amLODipine (NORVASC) 5 MG tablet    Sig: Take 1 tablet (5 mg total) by mouth every evening.    Dispense:  90 tablet    Refill:  3    Medications Discontinued During This Encounter  Medication Reason   isosorbide dinitrate (ISORDIL) 30 MG tablet Reorder   amLODipine (NORVASC) 5 MG tablet Reorder   No orders of the defined types were placed in this encounter.  Recommendations:   Ms. Laura Knapp is a 71 y.o. Caucasian female with MVP and S/P MV repair along with LAA ligation on 11/26/2015 at [facility], [place], [place] and paroxysmal A Fib maintains sinus on Sotalol, hypertension, hyperlipidemia, asymptomatic bilateral carotid artery stenosis and chronic diastolic heart failure.  Primary hypertension Blood pressure is now well controlled with addition of isosorbide dinitrate and amlodipine 5 mg.  She could not tolerate higher doses of amlodipine due to leg edema.   Hyponatremia I advised her to discontinue torsemide and use it only on a as needed basis when she has significant leg edema which she has chronically on and off.   Olmesartan has been discontinued due to persistent hyponatremia in spite of patient not been in acute decompensated heart failure.  Repeat BMP is pending.  She will obtain this today.  S/P Mitral valve repair Echocardiogram reviewed, valve repair appears very stable and LVEF is preserved. 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  Today no clinical evidence of heart failure.  Continue observation for now.  Asymptomatic carotid artery stenosis without infarction, bilateral Moderate bilateral asymptomatic stenosis, continue 6 monthly surveillance.  Lipids are at goal.  Risk factors are well controlled.   Addendum: Patient's labs reviewed, renal function stable at stage IIIa chronic kidney disease, sodium levels have improved.  Pro NT BNP markedly elevated again sepsis volume overload state.  This could potentially be her baseline.  She may be a good candidate for "H[place]" Trial. I left her a voice mail    Adrian Prows, MD, [facility] 03/23/2022, 12:43 PM Office: [phone] Pager: [phone]

## 2022-03-23 LAB — BASIC METABOLIC PANEL
BUN/Creatinine Ratio: 16 (ref 12–28)
BUN: 17 mg/dL (ref 8–27)
CO2: 25 mmol/L (ref 20–29)
Calcium: 9.1 mg/dL (ref 8.7–10.3)
Chloride: 95 mmol/L — ABNORMAL LOW (ref 96–106)
Creatinine, Ser: 1.09 mg/dL — ABNORMAL HIGH (ref 0.57–1.00)
Glucose: 111 mg/dL — ABNORMAL HIGH (ref 70–99)
Potassium: 5 mmol/L (ref 3.5–5.2)
Sodium: 132 mmol/L — ABNORMAL LOW (ref 134–144)
eGFR: 54 mL/min/{1.73_m2} — ABNORMAL LOW (ref >59)

## 2022-03-23 LAB — PRO B NATRIURETIC PEPTIDE: NT-Pro BNP: 1197 pg/mL — ABNORMAL HIGH (ref 0–301)

## 2022-05-31 ENCOUNTER — Other Ambulatory Visit: Payer: Self-pay | Admitting: Cardiology

## 2022-08-19 ENCOUNTER — Other Ambulatory Visit: Payer: Self-pay | Admitting: Cardiology

## 2022-08-24 ENCOUNTER — Other Ambulatory Visit: Payer: Self-pay

## 2022-08-24 MED ORDER — WARFARIN SODIUM 5 MG PO TABS: ORAL_TABLET | ORAL | 3 refills | Status: DC

## 2022-09-13 ENCOUNTER — Ambulatory Visit: Payer: Medicare Other

## 2022-09-13 DIAGNOSIS — I6523 Occlusion and stenosis of bilateral carotid arteries: Secondary | ICD-10-CM

## 2022-09-21 ENCOUNTER — Encounter: Payer: Self-pay | Admitting: Cardiology

## 2022-09-21 ENCOUNTER — Ambulatory Visit: Payer: Medicare Other | Admitting: Cardiology

## 2022-09-21 VITALS — BP 130/74 | HR 54 | Resp 20 | Ht 62.0 in | Wt 99.2 lb

## 2022-09-21 DIAGNOSIS — I493 Ventricular premature depolarization: Secondary | ICD-10-CM

## 2022-09-21 DIAGNOSIS — I6523 Occlusion and stenosis of bilateral carotid arteries: Secondary | ICD-10-CM

## 2022-09-21 DIAGNOSIS — Z9889 Other specified postprocedural states: Secondary | ICD-10-CM

## 2022-09-21 DIAGNOSIS — I050 Rheumatic mitral stenosis: Secondary | ICD-10-CM

## 2022-09-21 DIAGNOSIS — I480 Paroxysmal atrial fibrillation: Secondary | ICD-10-CM

## 2022-09-21 DIAGNOSIS — I10 Essential (primary) hypertension: Secondary | ICD-10-CM

## 2022-09-21 NOTE — Progress Notes (Addendum)
Primary Physician/Referring:  Richmond Campbell., PA-C  Patient ID: Laura Knapp, female    DOB: 1950-11-14, 72 y.o.   MRN: [id number]  Chief Complaint  Patient presents with   Atrial Fibrillation   Hypertension   Follow-up    6 months   HPI:    Laura Knapp  is a 72 y.o. Caucasian female with MVP and S/P MV repair along with LAA ligation on 11/26/2015 at [facility], [place], [place] and paroxysmal A Fib maintains sinus on Sotalol, hypertension, hyperlipidemia, asymptomatic bilateral carotid artery stenosis and chronic diastolic heart failure.  No specific complaints today, denies leg edema, denies worsening dyspnea, PND or orthopnea.  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   Cardiovascular:  Negative for chest pain, dyspnea on exertion and leg swelling.  Gastrointestinal:  Negative for melena.   Objective      09/21/2022    1:05 PM 03/22/2022    2:08 PM 02/03/2022    1:39 PM  Vitals  with BMI  Height 5\' 2"  5\' 2"  5\' 2"   Weight 99 lbs 3 oz 99 lbs 3 oz 101 lbs 3 oz  BMI 18.14 18.14 18.51  Systolic 130 132 409  Diastolic 74 75 87  Pulse 54 56 52    Blood pressure 130/74, pulse (!) 54, resp. rate 20, height 5\' 2"  (1.575 m), weight 99 lb 3.2 oz (45 kg), SpO2 96 %. Body mass index is 18.14 kg/m.  No data found.    Physical Exam Constitutional:      Appearance: She is underweight.  Neck:     Vascular: No carotid bruit or JVD.  Cardiovascular:     Rate and Rhythm: Normal rate and regular rhythm.     Pulses: Normal pulses and intact distal pulses.     Heart sounds: S1 normal and S2 normal. Murmur heard.     Midsystolic murmur is present with a grade of 3/6 at the apex.     Middiastolic murmur is present with a grade of 2/4 at the apex.     No gallop.  Pulmonary:     Effort: Pulmonary effort is normal.     Breath sounds: Rales (scattered, diffuse)  present.  Abdominal:     General: Bowel sounds are normal.     Palpations: Abdomen is soft.  Musculoskeletal:     Right lower leg: No edema.     Left lower leg: No edema.  Skin:    Capillary Refill: Capillary refill takes less than 2 seconds.    Radiology:   No results found.  Laboratory examination:    Lab Results  Component Value Date   NA 133 (L) 09/23/2022   K 4.6 09/23/2022   CO2 20 09/23/2022   GLUCOSE 85 09/23/2022   BUN 12 09/23/2022   CREATININE 1.01 (H) 09/23/2022   CALCIUM 9.1 09/23/2022   EGFR 59 (L) 09/23/2022   GFRNONAA 52 (L) 03/18/2020       Latest Ref Rng & Units 09/23/2022   11:55 AM 03/22/2022    3:07 PM 01/04/2022   10:16 AM  CMP  Glucose 70 - 99 mg/dL 85  161  92   BUN 8 - 27 mg/dL 12  17  14    Creatinine 0.57 - 1.00 mg/dL 0.96  0.45  4.09   Sodium 134 - 144 mmol/L 133  132  129   Potassium 3.5 - 5.2 mmol/L 4.6  5.0  4.7   Chloride 96 - 106 mmol/L 96  95  95   CO2 20 - 29 mmol/L 20  25  22    Calcium 8.7 - 10.3 mg/dL 9.1  9.1  9.3    BNP (last 3 results) Recent Labs     01/04/22 1016  BNP 193.0*  ProBNP (last 3 results) Recent Labs    03/22/22 1507  PROBNP 1,197*   External labs:  Labs 04/29/2022:  Sodium 133, potassium 4.4, BUN 12, creatinine 1.03, EGFR 58 mL.  Total cholesterol 139, triglycerides 54, HDL 62, LDL 55.  Hb 12.2/HCT 35.5, platelets 186, normal indicis.  Labs 12/03/2021:  TSH normal at 1.91.  Vitamin D 42.  Hb 12.8/HCT 38.0, platelets 228.  Labs 04/30/2021:  Sodium 133, potassium 3.9, BUN 14, creatinine 1.17, EGFR 50 mL, LFTs normal.  Compared to 10/22/2020, creatinine 0.90 and EGFR was normal.  Hb 12.3/HCT 37.2, platelets 207, normal indicis.  Cardiac Studies:   Nuclear stress test  [10/17/2015]:  1. Resting EKG demonstrated normal sinus rhythm, normal axis. No evidence of ischemia. Stress EKG is positive for myocardial ischemia. With exercise there is frequent PVCs, ventricular couplets and ventricular complaints, associated with 2 mm horizontal ST segment depression in the inferior and lateral leads. ST segment changes persisted for greater than 3 minutes into recovery. Stress terminated due to fatigue. The patient performed treadmill exercise using a Bruce protocol, completing 6 minutes. The patient completed an estimated workload of 7.05 METS. 2. Myocardial perfusion imaging is normal. Overall left ventricular systolic function was normal without regional wall motion abnormalities. The left ventricular ejection fraction was 89%. This is an intermediate risk study, clinical correlation recommended.This is an intermediate risk study due to arrhythmias and ST changes of ischemia. Multivessel disease and balanced ischemia cannot be excluded. Consider further cardiac work-up.  Coronary Angiogram  [10/16/2015]: Normal coronary arteries, mild coronary calcification. 3 plus MR. Mild pulmonary hypertension with right heart suggestive of moderate to severe MR. Preserved CO/CI by Fick.  MV repair in [place] 11/26/2015: Complex MV repair with  neo-chordal reconstruction anterior and posterior leaflets with 24 CG ring and ligation of left atrial appendage with 45 Atriclip device.  Echocardiogram 03/17/2022:  Normal LV systolic function with visual EF 50-55%. Left ventricle cavity is  normal in size. Normal global wall motion. Indeterminate diastolic filling pattern. Calculated EF 50%. Left atrial cavity is mildly dilated. Structurally normal trileaflet aortic valve.  Mild (Grade I) aortic regurgitation. Valvuloplasty of the mitral valve.  Mild (Grade I) mitral regurgitation. Mitral valve ring repair of the mitral annulus.  Structurally normal tricuspid valve.  Mild tricuspid regurgitation. Moderate pulmonary hypertension. RVSP measures 52 mmHg. Compared to 12/2020, moderate pulmonary hypertension is now present.  Carotid artery duplex 09/13/2022: Duplex suggests stenosis in the right internal carotid artery (16-49%). Duplex suggests stenosis in the left internal carotid artery (50-69%). Antegrade right vertebral artery flow. Antegrade left vertebral artery flow with hesitation. Compared to the study done on 01/07/2022, right ICA stenosis has regressed from >50 to 69%. Follow up in six months is appropriate if clinically indicated.    EKG:   EKG 09/21/2022: Normal sinus rhythm at rate of 61 beats minute, left atrial enlargement, normal axis.  Incomplete right bundle branch block.  Frequent PVCs, multifocal (3).  Compared to 02/03/2022, PVCs new.   Medications and allergies   Allergies  Allergen Reactions   Acetazolamide Rash    Rash on lower extremities coupled with sudden numbness in fingers and toes - appeared after two doses   Olmesartan Other (See Comments)    Hyponatremia   Codeine Itching   Lisinopril Cough    Current Outpatient Medications:    acetaminophen (TYLENOL) 325 MG tablet, Take 325 mg by mouth daily as needed for moderate pain. , Disp: , Rfl:    amLODipine (NORVASC) 5 MG tablet, Take 1 tablet (5 mg total) by  mouth every evening., Disp: 90 tablet, Rfl: 3   atorvastatin (LIPITOR) 40 MG tablet, TAKE 1 TABLET DAILY, Disp: 90 tablet, Rfl: 3   brimonidine (ALPHAGAN) 0.2 % ophthalmic solution, Place 1 drop into both eyes 3 (three) times daily. , Disp: , Rfl:    cetirizine (ZYRTEC) 10 MG tablet, Take one pill per day, Disp: , Rfl:    cyclobenzaprine (FLEXERIL) 10 MG tablet, Take 5-10 mg by mouth daily as needed for muscle spasms. , Disp: , Rfl:    dorzolamide-timolol (COSOPT) 22.3-6.8 MG/ML ophthalmic solution, Place 1 drop into both eyes 2 (two) times daily., Disp: , Rfl:    ENSURE (ENSURE), Take 237 mLs by mouth every other day. Chocolate, Disp: , Rfl:    ezetimibe (ZETIA) 10 MG tablet, TAKE 1 TABLET IN THE EVENING AFTER DINNER, Disp: 90 tablet, Rfl: 3   fluticasone (FLONASE) 50 MCG/ACT nasal spray, Place 2 sprays into both nostrils 2 (two) times daily., Disp: , Rfl:    hydrALAZINE (APRESOLINE) 50 MG tablet, TAKE 1 TABLET THREE TIMES A DAY, Disp: 270 tablet, Rfl: 3   isosorbide dinitrate (ISORDIL) 30 MG tablet, Take 1 tablet (30 mg total) by mouth 3 (three) times daily., Disp: 270 tablet, Rfl: 3   latanoprost (XALATAN) 0.005 % ophthalmic solution, Place 1 drop into both eyes at bedtime., Disp: , Rfl:    Magnesium Oxide -Mg Supplement 500 MG CAPS, Take 1 capsule (500 mg total) by mouth daily., Disp: 90 capsule, Rfl: 3   metoprolol succinate (TOPROL-XL) 100 MG 24 hr tablet, Take 1 tablet (100 mg total) by mouth daily. Take with or immediately following a meal., Disp: 90 tablet, Rfl: 3   omeprazole (PRILOSEC) 40 MG capsule, Take 40 mg by mouth daily., Disp: , Rfl:    pilocarpine (PILOCAR) 4 % ophthalmic solution, Place 1 drop into both eyes 3 (three) times daily. , Disp: , Rfl:  PROAIR HFA 108 (90 Base) MCG/ACT inhaler, Inhale into the lungs., Disp: , Rfl:    sotalol (BETAPACE) 160 MG tablet, TAKE 1 TABLET TWICE A DAY, Disp: 180 tablet, Rfl: 3   traMADol (ULTRAM) 50 MG tablet, Take 50 mg by mouth daily as  needed for moderate pain or severe pain. , Disp: , Rfl:    warfarin (COUMADIN) 5 MG tablet, TAKE 1 TABLET ON MONDAY, WEDNESDAY, AND FRIDAY. TAKE ONE-HALF (1/2) TABLET (2.5 MG) ALL OTHER DAYS, Disp: 90 tablet, Rfl: 3   zolpidem (AMBIEN) 10 MG tablet, Take 5 mg by mouth at bedtime as needed for sleep., Disp: , Rfl:    Assessment     ICD-10-CM   1. Asymptomatic carotid artery stenosis without infarction, bilateral  I65.23 PCV CAROTID DUPLEX (BILATERAL)    2. Primary hypertension  I10     3. Paroxysmal atrial fibrillation (HCC)  I48.0 EKG 12-Lead    Magnesium    Basic metabolic panel    Magnesium Oxide -Mg Supplement 500 MG CAPS    4. S/P mitral valve repair  Z98.890 PCV ECHOCARDIOGRAM COMPLETE    5. PVC (premature ventricular contraction)  I49.3 Magnesium    Basic metabolic panel    Magnesium Oxide -Mg Supplement 500 MG CAPS    6. Rheumatic mitral stenosis  I05.0 PCV ECHOCARDIOGRAM COMPLETE     Meds ordered this encounter  Medications   Magnesium Oxide -Mg Supplement 500 MG CAPS    Sig: Take 1 capsule (500 mg total) by mouth daily.    Dispense:  90 capsule    Refill:  3    There are no discontinued medications.  Orders Placed This Encounter  Procedures   Magnesium   Basic metabolic panel   EKG 12-Lead   PCV ECHOCARDIOGRAM COMPLETE    Standing Status:   Future    Standing Expiration Date:   09/21/2023   Recommendations:   Laura Knapp is a 72 y.o. Caucasian female with MVP and S/P MV repair along with LAA ligation on 11/26/2015 at [facility], [place], [place] and paroxysmal A Fib maintains sinus rhythm on Sotalol, hypertension, hyperlipidemia, asymptomatic bilateral carotid artery stenosis and chronic diastolic heart failure.  1. Asymptomatic carotid artery stenosis without infarction, bilateral I reviewed the results of the carotid artery duplex, fairly stable carotid stenosis.  Will continue 6 monthly surveillance.  Lipids under excellent control, presently on Eliquis for AF,  not on aspirin to reduce bleeding risk.  - EKG 12-Lead - PCV CAROTID DUPLEX (BILATERAL); Future  2. Primary hypertension Blood pressure is well-controlled.  I rechecked the blood pressure again and excellent control of blood pressure, home recordings have also been under good control.  Renal function has remained stable at stage IIIa chronic kidney disease.  She is not on an ACE inhibitor or ARB due to persistent severe hyponatremia.  3. Paroxysmal atrial fibrillation (HCC) She is maintaining sinus rhythm on sotalol.  She has frequent PVCs noted on EKG today hence we will obtain BMP and magnesium levels in view of patient being on sotalol and evaluate renal function. - Magnesium - Basic metabolic panel  4. S/P mitral valve repair Patient has a very prominent diastolic murmur and a soft systolic murmur at the apex, I would like to recheck her echocardiogram in 6 months for follow-up of mitral valve repair and mitral regurgitation.  - PCV ECHOCARDIOGRAM COMPLETE; Future  5. PVC (premature ventricular contraction) PVCs are new.  Electrolytes will be checked today.  She remains asymptomatic otherwise without clinical  evidence of heart failure. - Magnesium - Basic metabolic panel  6. Rheumatic mitral stenosis  - PCV ECHOCARDIOGRAM COMPLETE; Future    Yates Decamp, MD, [facility] 09/24/2022, 9:05 PM Office: [phone] Pager: [phone]

## 2022-09-24 LAB — BASIC METABOLIC PANEL
BUN/Creatinine Ratio: 12 (ref 12–28)
BUN: 12 mg/dL (ref 8–27)
CO2: 20 mmol/L (ref 20–29)
Calcium: 9.1 mg/dL (ref 8.7–10.3)
Chloride: 96 mmol/L (ref 96–106)
Creatinine, Ser: 1.01 mg/dL — ABNORMAL HIGH (ref 0.57–1.00)
Glucose: 85 mg/dL (ref 70–99)
Potassium: 4.6 mmol/L (ref 3.5–5.2)
Sodium: 133 mmol/L — ABNORMAL LOW (ref 134–144)
eGFR: 59 mL/min/{1.73_m2} — ABNORMAL LOW (ref >59)

## 2022-09-24 LAB — MAGNESIUM: Magnesium: 1.5 mg/dL — ABNORMAL LOW (ref 1.6–2.3)

## 2022-09-24 MED ORDER — MAGNESIUM OXIDE -MG SUPPLEMENT 500 MG PO CAPS
1.0000 | ORAL_CAPSULE | Freq: Every day | ORAL | 3 refills | Status: DC
Start: 2022-09-24 — End: 2022-09-28

## 2022-09-24 NOTE — Progress Notes (Signed)
Please let her know that the magnesium level is low and I wll send Rx for one tab daily of Magoxide

## 2022-09-24 NOTE — Addendum Note (Signed)
Addended by: Delrae Rend on: 09/24/2022 09:05 PM   Modules accepted: Orders

## 2022-09-28 ENCOUNTER — Other Ambulatory Visit: Payer: Self-pay

## 2022-09-28 DIAGNOSIS — I480 Paroxysmal atrial fibrillation: Secondary | ICD-10-CM

## 2022-09-28 DIAGNOSIS — I493 Ventricular premature depolarization: Secondary | ICD-10-CM

## 2022-09-28 MED ORDER — MAGNESIUM OXIDE -MG SUPPLEMENT 500 MG PO CAPS
1.0000 | ORAL_CAPSULE | Freq: Every day | ORAL | 3 refills | Status: DC
Start: 2022-09-28 — End: 2023-10-28

## 2022-09-28 NOTE — Progress Notes (Signed)
Called patient to inform her about her lab results. Patient understood to start taking magnesium.

## 2023-02-01 ENCOUNTER — Other Ambulatory Visit: Payer: Self-pay

## 2023-02-01 MED ORDER — WARFARIN SODIUM 5 MG PO TABS: ORAL_TABLET | ORAL | 3 refills | Status: DC

## 2023-02-01 MED ORDER — WARFARIN SODIUM 5 MG PO TABS: ORAL_TABLET | ORAL | 3 refills | Status: AC

## 2023-03-15 ENCOUNTER — Other Ambulatory Visit: Payer: TRICARE For Life (TFL)

## 2023-03-15 ENCOUNTER — Ambulatory Visit (HOSPITAL_COMMUNITY): Payer: Medicare Other | Attending: Cardiology

## 2023-03-15 DIAGNOSIS — Z9889 Other specified postprocedural states: Secondary | ICD-10-CM | POA: Insufficient documentation

## 2023-03-15 DIAGNOSIS — I050 Rheumatic mitral stenosis: Secondary | ICD-10-CM | POA: Diagnosis present

## 2023-03-15 LAB — ECHOCARDIOGRAM COMPLETE
Area-P 1/2: 1.86 cm2
MV VTI: 0.7 cm2
S' Lateral: 2.9 cm

## 2023-03-15 NOTE — Progress Notes (Signed)
I hope I am sending to the right persons. Let her know the echo is very stable from prior study 1 year ago

## 2023-03-15 NOTE — Progress Notes (Signed)
Echocardiogram 03/15/2023:  1. Left ventricular ejection fraction, by estimation, is 50 to 55%. The left ventricle has low normal function. The left ventricle has no regional wall motion abnormalities. Left ventricular diastolic parameters are indeterminate.  2. Right ventricular systolic function is normal. The right ventricular size is normal. There is mildly elevated pulmonary artery systolic pressure. The estimated right ventricular systolic pressure is 43.8 mmHg.  3. Average Pressure Half time greater than 130 ms with mean gradient greater thant 8 mm Hg, this can be seen in pathologic mitral valve obstruction. The mitral valve has been repaired/replaced. Mild mitral valve regurgitation. The mean mitral valve gradient is 9.0 mmHg with average heart rate of 52 bpm. There is a Valvuloplasty present in the mitral position.    4. The aortic valve was not well visualized. Aortic valve regurgitation is mild.  5. The inferior vena cava is dilated in size with >50% respiratory variability, suggesting right atrial pressure of 8 mmHg. No significant change in mean gradient of 13.3 mmHg and PA pressure was 52 mmHg compared to 03/17/2022.

## 2023-03-17 ENCOUNTER — Telehealth: Payer: Self-pay

## 2023-03-17 NOTE — Telephone Encounter (Signed)
Spoke with patient regarding her echocardiogram results.

## 2023-03-17 NOTE — Telephone Encounter (Signed)
-----   Message from Laura Knapp sent at 03/15/2023  8:30 PM EDT ----- I hope I am sending to the right persons. Let her know the echo is very stable from prior study 1 year ago

## 2023-03-22 ENCOUNTER — Ambulatory Visit (HOSPITAL_COMMUNITY)
Admission: RE | Admit: 2023-03-22 | Discharge: 2023-03-22 | Disposition: A | Discharge status: Home / Self Care | Payer: Medicare Other | Source: Ambulatory Visit | Attending: Cardiology | Admitting: Cardiology

## 2023-03-22 DIAGNOSIS — I6523 Occlusion and stenosis of bilateral carotid arteries: Secondary | ICD-10-CM | POA: Diagnosis not present

## 2023-03-23 ENCOUNTER — Ambulatory Visit: Payer: Self-pay | Admitting: Cardiology

## 2023-05-25 ENCOUNTER — Other Ambulatory Visit: Payer: Self-pay | Admitting: Cardiology

## 2023-07-11 ENCOUNTER — Telehealth: Payer: Self-pay | Admitting: Cardiology

## 2023-07-11 MED ORDER — SOTALOL HCL 160 MG PO TABS: 160.0000 mg | ORAL_TABLET | Freq: Two times a day (BID) | ORAL | 0 refills | Status: DC

## 2023-07-11 NOTE — Telephone Encounter (Signed)
*  STAT* If patient is at the pharmacy, call can be transferred to refill team.   1. Which medications need to be refilled? (please list name of each medication and dose if known) sotalol (BETAPACE) 160 MG tablet   2. Which pharmacy/location (including street and city if local pharmacy) is medication to be sent to? [facility] - [place], [place] - [address]   3. Do they need a 30 day or 90 day supply? 90

## 2023-07-12 ENCOUNTER — Ambulatory Visit: Payer: Medicare Other | Admitting: Cardiology

## 2023-07-15 ENCOUNTER — Ambulatory Visit: Payer: Medicare Other | Admitting: Cardiology

## 2023-08-09 ENCOUNTER — Telehealth: Payer: Self-pay | Admitting: *Deleted

## 2023-08-09 NOTE — Telephone Encounter (Signed)
 I spoke with patient. She has sotalol on her med list.  Patient reports she has been having a lot of gas and bowel movements have been loose since starting Magnesium several months ago.  Will sometimes have 2-3 bowel movements per day.  Is taking Magnesium oxide 500 mg daily .  This is a prescription.  Magnesium was started at based on 09/23/22 lab results.  Patient drinks one beer and one mixed drink per night.  She is asking if OK to continue this

## 2023-08-09 NOTE — Telephone Encounter (Signed)
-----   Message from [facility] sent at 08/09/2023  6:32 AM EDT ----- Regarding: FW: Low Mg levels  ----- Message ----- From: Yates Decamp, MD Sent: 08/08/2023   4:08 PM EDT To: Elliot Cousin, RMA Subject: Low Mg levels                                  She is not on Diuretics or anti arrhythmic medications. Unless she is having loose stools which may explain her low Mg and Potassium, and if so I would recommend that she pick up over the counter magnesium supplements and take daily until resolved.  However if no GI issues, then I would recommend that she get Mg tablets over the counter and take daily ----- Message ----- From: Elliot Cousin, RMA Sent: 08/08/2023   7:08 AM EDT To: Yates Decamp, MD; Dossie Arbour, RN  Labs needing attention from PCP

## 2023-08-10 NOTE — Telephone Encounter (Signed)
 Please ask her to stop Magoxide for now.  Almonds, cashews, black beans, quinoa and spinach has high magnesium content and she should supplement this with her diet on a daily basis, we could check her magnesium in 4 weeks.

## 2023-08-11 NOTE — Telephone Encounter (Signed)
 Patient notified.  She reports she cannot eat greens due to being on coumadin.  I asked her to add the other recommended foods to her diet.  She does not feel she can add these foods.   Patient reports gas is not constant.  She reports most bowel movements are loose but she does not have a bowel movement everyday.  She would prefer to continue MagOxide instead of changing diet Will forward to Dr Jacinto Halim for review/recommendations

## 2023-08-12 NOTE — Telephone Encounter (Signed)
 That is fine with me for now

## 2023-08-16 NOTE — Telephone Encounter (Signed)
 Patient notified.  She has appointment with Dr Jacinto Halim on 4/22 and Magnesium level can be checked at that time if needed.

## 2023-08-16 NOTE — Telephone Encounter (Signed)
 Left message to call office

## 2023-09-13 ENCOUNTER — Ambulatory Visit: Payer: Medicare Other | Attending: Cardiology | Admitting: Cardiology

## 2023-09-13 ENCOUNTER — Encounter: Payer: Self-pay | Admitting: Cardiology

## 2023-09-13 VITALS — BP 130/82 | HR 59 | Resp 16 | Ht 62.0 in | Wt 94.2 lb

## 2023-09-13 DIAGNOSIS — E7800 Pure hypercholesterolemia, unspecified: Secondary | ICD-10-CM | POA: Diagnosis present

## 2023-09-13 DIAGNOSIS — Z9889 Other specified postprocedural states: Secondary | ICD-10-CM | POA: Diagnosis present

## 2023-09-13 DIAGNOSIS — I480 Paroxysmal atrial fibrillation: Secondary | ICD-10-CM | POA: Diagnosis not present

## 2023-09-13 DIAGNOSIS — I10 Essential (primary) hypertension: Secondary | ICD-10-CM | POA: Diagnosis present

## 2023-09-13 NOTE — Patient Instructions (Signed)
 Medication Instructions:  Your physician recommends that you continue on your current medications as directed. Please refer to the Current Medication list given to you today.  *If you need a refill on your cardiac medications before your next appointment, please call your pharmacy*  Lab Work: none If you have labs (blood work) drawn today and your tests are completely normal, you will receive your results only by: MyChart Message (if you have MyChart) OR A paper copy in the mail If you have any lab test that is abnormal or we need to change your treatment, we will call you to review the results.  Testing/Procedures: none  Follow-Up: At [facility], you and your health needs are our priority.  As part of our continuing mission to provide you with exceptional heart care, our providers are all part of one team.  This team includes your primary Cardiologist (physician) and Advanced Practice Providers or APPs (Physician Assistants and Nurse Practitioners) who all work together to provide you with the care you need, when you need it.  Your next appointment:   12 month(s)  Provider:   Yates Decamp, MD     We recommend signing up for the patient portal called "MyChart".  Sign up information is provided on this After Visit Summary.  MyChart is used to connect with patients for Virtual Visits (Telemedicine).  Patients are able to view lab/test results, encounter notes, upcoming appointments, etc.  Non-urgent messages can be sent to your provider as well.   To learn more about what you can do with MyChart, go to [email or web address].   Other Instructions        1st Floor: - Lobby - Registration  - Pharmacy  - Lab - Cafe  2nd Floor: - PV Lab - Diagnostic Testing (echo, CT, nuclear med)  3rd Floor: - Vacant  4th Floor: - TCTS (cardiothoracic surgery) - AFib Clinic - Structural Heart Clinic - Vascular Surgery  - Vascular Ultrasound  5th Floor: - [facility]  (general and EP) - Clinical Pharmacy for coumadin, hypertension, lipid, weight-loss medications, and med management appointments    Valet parking services will be available as well.

## 2023-09-13 NOTE — Progress Notes (Signed)
 Cardiology Office Note:  .   Date:  09/13/2023  ID:  Laura Knapp, DOB 11/03/1950, MRN [id number] PCP: Lory Rough PA-C  [facility] Providers Cardiologist:  Knox Perl, MD   History of Present Illness: .   Laura Knapp is a 73 y.o. Caucasian female with MVP and S/P MV repair along with LAA ligation on 11/26/2015 at [facility], [place], [place] and paroxysmal A Fib maintains sinus on Sotalol , hypertension, hyperlipidemia, asymptomatic bilateral carotid artery stenosis and chronic diastolic heart failure.   Discussed the use of AI scribe software for clinical note transcription with the patient, who gave verbal consent to proceed.  History of Present Illness The patient, with a history of mitral valve repair, rheumatic heart disease, and atrial fibrillation, presents for a routine follow-up. She reports feeling 'the best I have felt in, I don't know how long' since starting magnesium  supplementation for previously low levels. She initially experienced gastrointestinal side effects, but these have since resolved. She also started an iron supplement due to low iron levels, and she believes the combination of these two supplements has contributed to her improved well-being.  She reports no issues with her current medication regimen, which includes warfarin for her mitral valve repair and rheumatic heart disease, amlodipine  for blood pressure control, metoprolol , sotalol , and hydralazine . She also takes atorvastatin  and Zetia  for cholesterol management.  The patient's only complaint is difficulty gaining weight, but she otherwise feels well. She expresses satisfaction with her current state of health and is pleased with her progress since her mitral valve repair in 2017.  Labs   External Labs:  Care Everywhere labs 08/02/2023:  Sodium 133, potassium 4.3, BUN 9, creatinine 0.89.  Magnesium  1.4.  Iron studies reveal severe iron deficiency.  Hb 11.6/HCT 35.4, platelets 205.  Microcytic  indicis.  Lipid profile 11/01/2022:  Total cholesterol 06/02/1957, triglycerides 66, HDL 84, LDL 58.  Review of Systems  Cardiovascular:  Negative for chest pain, dyspnea on exertion and leg swelling.   Physical Exam:   VS:  BP 130/82 (BP Location: Left Arm, Patient Position: Sitting, Cuff Size: Normal)   Pulse (!) 59   Resp 16   Ht 5\' 2"  (1.575 m)   Wt 94 lb 3.2 oz (42.7 kg)   SpO2 96%   BMI 17.23 kg/m    Wt Readings from Last 3 Encounters:  09/13/23 94 lb 3.2 oz (42.7 kg)  09/21/22 99 lb 3.2 oz (45 kg)  03/22/22 99 lb 3.2 oz (45 kg)    Physical Exam Neck:     Vascular: No carotid bruit or JVD.  Cardiovascular:     Rate and Rhythm: Normal rate and regular rhythm.     Pulses: Intact distal pulses.     Heart sounds: Normal heart sounds. No murmur heard.    No gallop.  Pulmonary:     Effort: Pulmonary effort is normal.     Breath sounds: Normal breath sounds.  Abdominal:     General: Bowel sounds are normal.     Palpations: Abdomen is soft.  Musculoskeletal:     Right lower leg: No edema.     Left lower leg: No edema.    Studies Reviewed: Aaron Aas    ECHOCARDIOGRAM COMPLETE 03/15/2023 1. Left ventricular ejection fraction, by estimation, is 50 to 55%. The left ventricle has low normal function. The left ventricle has no regional wall motion abnormalities. Left ventricular diastolic parameters are indeterminate. 2. Right ventricular systolic function is normal. The right ventricular size is  normal. There is mildly elevated pulmonary artery systolic pressure. The estimated right ventricular systolic pressure is 43.8 mmHg. 3. Average Pressure Half time greater than 130 ms with mean gradient greater thant 8 mm Hg, this can be seen in pathologic mitral valve obstruction. The mitral valve has been repaired/replaced. Mild mitral valve regurgitation. The mean mitral valve gradient is 9.0 mmHg with average heart rate of 52 bpm. There is a Valvuloplasty present in the mitral position.  Procedure Date: 11/26/2015. 4. The aortic valve was not well visualized. Aortic valve regurgitation is mild. 5. The inferior vena cava is dilated in size with >50% respiratory variability, suggesting right atrial pressure of 8 mmHg.   Comparison(s): Unable to view 2023 study. Compared to prior reporting, RVSP has decreased slightly.  Carotid artery duplex 03/22/2023: Bilateral diffuse mild disease without hemodynamically significant stenosis. Antegrade vertebral artery flow. Normal bilateral subclavian flow. EKG:    EKG Interpretation Date/Time:  Tuesday September 13 2023 13:23:04 EDT Ventricular Rate:  59 PR Interval:  182 QRS Duration:  78 QT Interval:  426 QTC Calculation: 421 R Axis:   77  Text Interpretation: EKG 09/13/2023: Normal sinus rhythm at rate of 59 bpm, PVCs (2), PACs (2) otherwise normal EKG. Confirmed by Voncile Schwarz, Jagadeesh [phone]) on 09/13/2023 1:35:31 PM    EKG 09/21/2022: Normal sinus rhythm at rate of 61 beats minute, left atrial enlargement, normal axis. Incomplete right bundle branch block. Frequent PVCs, multifocal (3).   Medications and allergies    Allergies  Allergen Reactions   Acetazolamide Rash    Rash on lower extremities coupled with sudden numbness in fingers and toes - appeared after two doses   Olmesartan  Other (See Comments)    Hyponatremia   Codeine Itching   Lisinopril Cough    Current Outpatient Medications:    acetaminophen  (TYLENOL ) 325 MG tablet, Take 325 mg by mouth daily as needed for moderate pain. , Disp: , Rfl:    amLODipine  (NORVASC ) 5 MG tablet, Take 1 tablet (5 mg total) by mouth every evening., Disp: 90 tablet, Rfl: 3   atorvastatin  (LIPITOR) 40 MG tablet, TAKE 1 TABLET DAILY, Disp: 90 tablet, Rfl: 3   brimonidine  (ALPHAGAN ) 0.2 % ophthalmic solution, Place 1 drop into both eyes 3 (three) times daily. , Disp: , Rfl:    cetirizine (ZYRTEC) 10 MG tablet, Take one pill per day, Disp: , Rfl:    cyclobenzaprine  (FLEXERIL ) 10 MG tablet, Take  5-10 mg by mouth daily as needed for muscle spasms. , Disp: , Rfl:    dorzolamide -timolol  (COSOPT ) 22.3-6.8 MG/ML ophthalmic solution, Place 1 drop into both eyes 2 (two) times daily., Disp: , Rfl:    ENSURE (ENSURE), Take 237 mLs by mouth every other day. Chocolate, Disp: , Rfl:    ezetimibe  (ZETIA ) 10 MG tablet, TAKE 1 TABLET IN THE EVENING AFTER DINNER, Disp: 60 tablet, Rfl: 1   fluticasone (FLONASE) 50 MCG/ACT nasal spray, Place 2 sprays into both nostrils 2 (two) times daily., Disp: , Rfl:    hydrALAZINE  (APRESOLINE ) 50 MG tablet, TAKE 1 TABLET THREE TIMES A DAY, Disp: 270 tablet, Rfl: 3   isosorbide  dinitrate (ISORDIL ) 30 MG tablet, Take 1 tablet (30 mg total) by mouth 3 (three) times daily., Disp: 270 tablet, Rfl: 3   Magnesium  Oxide -Mg Supplement 500 MG CAPS, Take 1 capsule (500 mg total) by mouth daily., Disp: 90 capsule, Rfl: 3   metoprolol  succinate (TOPROL -XL) 100 MG 24 hr tablet, Take 1 tablet (100 mg total) by mouth daily. Take  with or immediately following a meal., Disp: 90 tablet, Rfl: 3   omeprazole (PRILOSEC) 40 MG capsule, Take 40 mg by mouth daily., Disp: , Rfl:    pilocarpine  (PILOCAR) 4 % ophthalmic solution, Place 1 drop into both eyes 3 (three) times daily. , Disp: , Rfl:    PROAIR HFA 108 (90 Base) MCG/ACT inhaler, Inhale into the lungs., Disp: , Rfl:    ROCKLATAN 0.02-0.005 % SOLN, Place 1 drop into both eyes daily., Disp: , Rfl:    sotalol  (BETAPACE ) 160 MG tablet, Take 1 tablet (160 mg total) by mouth 2 (two) times daily., Disp: 180 tablet, Rfl: 0   traMADol  (ULTRAM ) 50 MG tablet, Take 50 mg by mouth daily as needed for moderate pain or severe pain. , Disp: , Rfl:    Vitamin D, Ergocalciferol, (DRISDOL) 1.25 MG (50000 UNIT) CAPS capsule, Take 50,000 Units by mouth once a week., Disp: , Rfl:    warfarin (COUMADIN ) 5 MG tablet, TAKE 1 TABLET ON MONDAY, WEDNESDAY, AND FRIDAY. TAKE ONE-HALF (1/2) TABLET (2.5 MG) ALL OTHER DAYS, Disp: 90 tablet, Rfl: 3   zolpidem  (AMBIEN ) 10  MG tablet, Take 5 mg by mouth at bedtime as needed for sleep., Disp: , Rfl:    No orders of the defined types were placed in this encounter.    Medications Discontinued During This Encounter  Medication Reason   warfarin (COUMADIN ) 5 MG tablet Duplicate   latanoprost  (XALATAN ) 0.005 % ophthalmic solution Change in therapy     ASSESSMENT AND PLAN: .      ICD-10-CM   1. Paroxysmal atrial fibrillation (HCC)  I48.0 EKG 12-Lead    2. S/P mitral valve repair  Z98.890     3. Primary hypertension  I10     4. Hypercholesteremia  E78.00       Assessment and Plan Assessment & Plan Atrial fibrillation   Atrial fibrillation is well-controlled with a regular rhythm on EKG, indicating effective management. Continue metoprolol  succinate 100 mg once daily, sotalol  160 mg BID as prescribed, and warfarin for anticoagulation due to mitral valve repair and rheumatic heart disease.  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 The mitral valve repair from 2017 functions well, with no audible abnormalities on auscultation. The September 2024 echocardiogram confirms good mitral valve function. Continue warfarin for anticoagulation.  Carotid artery stenosis   Carotid artery stenosis has improved significantly with current treatment. There is no significant plaque, and no bruits are audible in the neck arteries. Continue atorvastatin  40 mg once daily and Zetia  10 mg once daily.  Hypertension   Hypertension is well-controlled with a slightly elevated blood pressure of 130/82 mmHg, within an acceptable range. Magnesium  supplementation may aid in blood pressure control. Continue amlodipine  5 mg once daily, metoprolol  succinate 100 mg once daily, hydralazine  50 mg three times daily, and magnesium  supplementation as prescribed.  Hyperlipidemia   Hyperlipidemia is well-managed with LDL cholesterol reduced to 58 mg/dL. Continue  atorvastatin  40 mg once daily and Zetia  10 mg once daily.  Low magnesium  levels   Low magnesium  levels are effectively managed with supplementation. She feels well and has adjusted without gastrointestinal issues. Continue magnesium  supplementation as prescribed.  Signed,  Knox Perl, MD, [facility] 09/13/2023, 1:51 PM [facility] [address] [place], [place] [address] Phone: [phone]. Fax:  [phone]

## 2023-09-22 ENCOUNTER — Other Ambulatory Visit: Payer: Self-pay | Admitting: Cardiology

## 2023-10-06 ENCOUNTER — Other Ambulatory Visit: Payer: Self-pay | Admitting: Cardiology

## 2023-10-10 ENCOUNTER — Other Ambulatory Visit: Payer: Self-pay | Admitting: Cardiology

## 2023-10-16 ENCOUNTER — Emergency Department (HOSPITAL_COMMUNITY)

## 2023-10-16 ENCOUNTER — Emergency Department (HOSPITAL_COMMUNITY)
Admission: EM | Admit: 2023-10-16 | Discharge: 2023-10-16 | Disposition: A | Discharge status: Home / Self Care | Attending: Emergency Medicine | Admitting: Emergency Medicine

## 2023-10-16 DIAGNOSIS — Y92019 Unspecified place in single-family (private) house as the place of occurrence of the external cause: Secondary | ICD-10-CM | POA: Diagnosis not present

## 2023-10-16 DIAGNOSIS — S39012A Strain of muscle, fascia and tendon of lower back, initial encounter: Secondary | ICD-10-CM

## 2023-10-16 DIAGNOSIS — W010XXA Fall on same level from slipping, tripping and stumbling without subsequent striking against object, initial encounter: Secondary | ICD-10-CM | POA: Diagnosis not present

## 2023-10-16 DIAGNOSIS — S0990XA Unspecified injury of head, initial encounter: Secondary | ICD-10-CM | POA: Diagnosis present

## 2023-10-16 DIAGNOSIS — S0003XA Contusion of scalp, initial encounter: Secondary | ICD-10-CM | POA: Diagnosis not present

## 2023-10-16 DIAGNOSIS — M545 Low back pain, unspecified: Secondary | ICD-10-CM | POA: Diagnosis not present

## 2023-10-16 DIAGNOSIS — S80211A Abrasion, right knee, initial encounter: Secondary | ICD-10-CM | POA: Diagnosis not present

## 2023-10-16 DIAGNOSIS — W19XXXA Unspecified fall, initial encounter: Secondary | ICD-10-CM

## 2023-10-16 DIAGNOSIS — R0789 Other chest pain: Secondary | ICD-10-CM | POA: Insufficient documentation

## 2023-10-16 DIAGNOSIS — Z7901 Long term (current) use of anticoagulants: Secondary | ICD-10-CM | POA: Insufficient documentation

## 2023-10-16 NOTE — ED Provider Notes (Signed)
 [place] EMERGENCY DEPARTMENT AT [facility] Provider Note   CSN: [id number] Arrival date & time: 10/16/23  1201     History  Chief Complaint  Patient presents with   Laura Knapp    Laura Knapp is a 73 y.o. female.   Fall   This patient is a 73-year-old female, she presents after having a fall that occurred yesterday.  She is on warfarin because of chronic heart disease, she fell at her house in [place]  landing on the cement and went to the local hospital where she was diagnosed with a proximal humerus fracture.  She was placed in an appropriate sling and a splint, however this morning she woke up with some lower back pain as well as some pain in the posterior right ribs and some pain when she takes of breath.  She also noticed a bruise on the side of her head that she did not have yesterday.  She has no headache or neck pain, no dizziness nausea or vomiting.    Home Medications Prior to Admission medications   Medication Sig Start Date End Date Taking? Authorizing Provider  acetaminophen  (TYLENOL ) 325 MG tablet Take 325 mg by mouth daily as needed for moderate pain.     [provider]  amLODipine  (NORVASC ) 5 MG tablet Take 1 tablet (5 mg total) by mouth every evening. 03/22/22 09/13/23  Knox Perl, MD  atorvastatin  (LIPITOR) 40 MG tablet TAKE 1 TABLET DAILY 10/06/23   Knox Perl, MD  brimonidine  (ALPHAGAN ) 0.2 % ophthalmic solution Place 1 drop into both eyes 3 (three) times daily.  07/11/15   [provider]  cetirizine (ZYRTEC) 10 MG tablet Take one pill per day 03/24/20   [provider]  cyclobenzaprine  (FLEXERIL ) 10 MG tablet Take 5-10 mg by mouth daily as needed for muscle spasms.     [provider]  dorzolamide -timolol  (COSOPT ) 22.3-6.8 MG/ML ophthalmic solution Place 1 drop into both eyes 2 (two) times daily.    [provider]  ENSURE (ENSURE) Take 237 mLs by mouth every other day. Chocolate    [provider]  ezetimibe  (ZETIA ) 10 MG tablet TAKE 1 TABLET IN THE EVENING AFTER DINNER 09/22/23   Knox Perl, MD  fluticasone (FLONASE) 50 MCG/ACT nasal spray Place 2 sprays into both nostrils 2 (two) times daily. 02/02/21   [provider]  hydrALAZINE  (APRESOLINE ) 50 MG tablet TAKE 1 TABLET THREE TIMES A DAY 11/25/21   Knox Perl, MD  isosorbide  dinitrate (ISORDIL ) 30 MG tablet Take 1 tablet (30 mg total) by mouth 3 (three) times daily. 03/22/22   Knox Perl, MD  Magnesium  Oxide -Mg Supplement 500 MG CAPS Take 1 capsule (500 mg total) by mouth daily. 09/28/22   Knox Perl, MD  metoprolol  succinate (TOPROL -XL) 100 MG 24 hr tablet Take 1 tablet (100 mg total) by mouth daily. Take with or immediately following a meal. 12/23/21   Knox Perl, MD  omeprazole (PRILOSEC) 40 MG capsule Take 40 mg by mouth daily.    [provider]  pilocarpine  (PILOCAR) 4 % ophthalmic solution Place 1 drop into both eyes 3 (three) times daily.     [provider]  PROAIR HFA 108 (90 Base) MCG/ACT inhaler Inhale into the lungs. 04/03/20   [provider]  ROCKLATAN 0.02-0.005 % SOLN Place 1 drop into both eyes daily. 07/11/23   [provider]  sotalol  (BETAPACE ) 160 MG tablet TAKE 1 TABLET TWICE A DAY 10/10/23   Knox Perl,  MD  traMADol  (ULTRAM ) 50 MG tablet Take 50 mg by mouth daily as needed for moderate pain or severe pain.     [provider]  Vitamin D, Ergocalciferol, (DRISDOL) 1.25 MG (50000 UNIT) CAPS capsule Take 50,000 Units by mouth once a week.    [provider]  warfarin (COUMADIN ) 5 MG tablet TAKE 1 TABLET ON MONDAY, WEDNESDAY, AND FRIDAY. TAKE ONE-HALF (1/2) TABLET (2.5 MG) ALL OTHER DAYS 02/01/23   Knox Perl, MD  zolpidem  (AMBIEN ) 10 MG tablet Take 5 mg by mouth at bedtime as needed for sleep.    [provider]      Allergies    Acetazolamide, Olmesartan , Codeine, and Lisinopril    Review of Systems   Review of Systems  All other systems  reviewed and are negative.   Physical Exam Updated Vital Signs There were no vitals taken for this visit. Physical Exam Vitals and nursing note reviewed.  Constitutional:      General: She is not in acute distress.    Appearance: She is well-developed.  HENT:     Head: Normocephalic.     Comments: Bruising over the R temporal scalp    Mouth/Throat:     Pharynx: No oropharyngeal exudate.  Eyes:     General: No scleral icterus.       Right eye: No discharge.        Left eye: No discharge.     Conjunctiva/sclera: Conjunctivae normal.     Pupils: Pupils are equal, round, and reactive to light.  Neck:     Thyroid : No thyromegaly.     Vascular: No JVD.  Cardiovascular:     Rate and Rhythm: Normal rate and regular rhythm.     Heart sounds: Normal heart sounds. No murmur heard.    No friction rub. No gallop.  Pulmonary:     Effort: Pulmonary effort is normal. No respiratory distress.     Breath sounds: Normal breath sounds. No wheezing or rales.  Chest:     Chest wall: Tenderness (posterior R ribs) present.  Abdominal:     General: Bowel sounds are normal. There is no distension.     Palpations: Abdomen is soft. There is no mass.     Tenderness: There is no abdominal tenderness.  Musculoskeletal:        General: Tenderness and signs of injury present. Normal range of motion.     Cervical back: Normal range of motion and neck supple.     Comments: Right upper extremity in a sling, good pulses and sensation and cap refill distally.  Right lower extremity and left lower extremity examined with full range of motion, mild abrasion over the right knee but full range of motion and straight leg raise without difficulty.  Mild tenderness over the lumbar spine and right paraspinal muscles  Lymphadenopathy:     Cervical: No cervical adenopathy.  Skin:    General: Skin is warm and dry.     Findings: Bruising present. No erythema or rash.  Neurological:     Mental Status: She is alert.      Coordination: Coordination normal.     Comments: Awake and alert and follows commands without difficulty, she can sit up in the bed without any difficulty and can straight leg raise bilaterally  Psychiatric:        Behavior: Behavior normal.     ED Results / Procedures / Treatments   Labs (all labs ordered are listed, but only abnormal results are  displayed) Labs Reviewed - No data to display  EKG None  Radiology No results found.  Procedures Procedures    Medications Ordered in ED Medications - No data to display  ED Course/ Medical Decision Making/ A&P                                 Medical Decision Making Amount and/or Complexity of Data Reviewed Radiology: ordered.    This patient presents to the ED for concern of trauma differential diagnosis includes rib fracture, lumbar spine fracture, intracranial injury    Additional history obtained   Additional history obtained from Electronic Medical Record External records from outside source obtained and reviewed including medical record   Lab Tests:  I Ordered, and personally interpreted labs.  The pertinent results include: Not indicated   Imaging Studies ordered:  I ordered imaging studies including rib x-rays, lumbar spine x-rays and CT scan of the brain I independently visualized and interpreted imaging which showed no signs of fracture of the spine or the ribs, CT scan of the brain without findings I agree with the radiologist interpretation   Problem List / ED Course:  Stable for discharge, no new fractures   I have discussed with the patient at the bedside the results, and the meaning of these results.  They have had opportunity to ask questions,  expressed their understanding to the need for follow-up with primary care physician   Social Determinants of Health:  Elderly           Final Clinical Impression(s) / ED Diagnoses Final diagnoses:  None    Rx / DC Orders ED  Discharge Orders     None         Early Glisson, MD 10/16/23 [phone]

## 2023-10-16 NOTE — ED Triage Notes (Signed)
 Pt arrived via [facility] from home c/o a fall that occurred x 2 days ago at a family gathering as she was trying to go through the doorway, pt tripped and fell on her face. Was seen at a hospital in [place] after the fall occurred and was discharged. They did find at that hospital that pt has a R broken humerus. Pt states she is now having chest tightness that is sharp at times, which started yesterday. Pt also c/o back pain. Rates pain 8/10. Pt is on blood thinners

## 2023-10-16 NOTE — Discharge Instructions (Signed)
 Your testing thankfully shows no broken ribs, no broken spine and no bleeding on the brain or fracture of the skull.  Most likely you have some areas that are bumped and bruised and strained and this will take a couple of weeks to get better.  I would strongly recommend that you continue taking Tylenol  up to 500 mg every 6 hours as needed for pain, ice packs to help with swelling and inflammation and make sure you follow-up with the orthopedic surgeon as recommended by your ER visit yesterday.  I have given you the breathing machine called an incentive spirometer, please use this to help take deep breaths and prevent pneumonia

## 2023-10-28 ENCOUNTER — Telehealth: Payer: Self-pay | Admitting: Cardiology

## 2023-10-28 DIAGNOSIS — I493 Ventricular premature depolarization: Secondary | ICD-10-CM

## 2023-10-28 DIAGNOSIS — I480 Paroxysmal atrial fibrillation: Secondary | ICD-10-CM

## 2023-10-28 MED ORDER — MAGNESIUM OXIDE -MG SUPPLEMENT 500 MG PO CAPS: 1.0000 | ORAL_CAPSULE | Freq: Every day | ORAL | 3 refills | Status: AC

## 2023-10-28 NOTE — Telephone Encounter (Signed)
 RX sent to requested Pharmacy

## 2023-10-28 NOTE — Telephone Encounter (Signed)
*  STAT* If patient is at the pharmacy, call can be transferred to refill team.   1. Which medications need to be refilled? (please list name of each medication and dose if known)   Magnesium  Oxide -Mg Supplement 500 MG CAPS   2. Which pharmacy/location (including street and city if local pharmacy) is medication to be sent to? [id number] - [place], [place] - [address] Phone: [phone]  Fax: [phone]     3. Do they need a 30 day or 90 day supply? 90

## 2024-03-14 ENCOUNTER — Telehealth: Payer: Self-pay | Admitting: Cardiology

## 2024-03-14 DIAGNOSIS — I480 Paroxysmal atrial fibrillation: Secondary | ICD-10-CM

## 2024-03-14 MED ORDER — METOPROLOL SUCCINATE ER 100 MG PO TB24: 100.0000 mg | ORAL_TABLET | Freq: Every day | ORAL | 1 refills | Status: AC

## 2024-03-14 NOTE — Telephone Encounter (Signed)
*  STAT* If patient is at the pharmacy, call can be transferred to refill team.   1. Which medications need to be refilled? (please list name of each medication and dose if known)    metoprolol succinate (TOPROL-XL) 100 MG 24 hr tablet    2. Which pharmacy/location (including street and city if local pharmacy) is medication to be sent to?   [facility] - [place], [place] - [address] Phone: [phone]  Fax: [phone]    3. Do they need a 30 day or 90 day supply? 90

## 2024-03-14 NOTE — Telephone Encounter (Signed)
 RX sent in

## 2024-04-10 ENCOUNTER — Telehealth: Payer: Self-pay

## 2024-04-10 NOTE — Telephone Encounter (Signed)
 Attempted to call patient, No answer.  Called [facility] advised [facility] does not manage or monitor pt's INRs therefor we cannot refill this Warfarin rx.  Advised to send rx refill to pt's PCP.  Denied Warfarin refill through our office.

## 2024-05-28 ENCOUNTER — Telehealth: Payer: Self-pay | Admitting: Cardiology

## 2024-05-28 NOTE — Telephone Encounter (Signed)
" °*  STAT* If patient is at the pharmacy, call can be transferred to refill team.   1. Which medications need to be refilled? (please list name of each medication and dose if known) warfarin (COUMADIN ) 5 MG tablet    2. Would you like to learn more about the convenience, safety, & potential cost savings by using the [facility]? No   3. Are you open to using the [facility] (Type [facility].) No   4. Which pharmacy/location (including street and city if local pharmacy) is medication to be sent to?  [facility] - [place], [place] - [address]   5. Do they need a 30 day or 90 day supply? 90 day  "

## 2024-05-29 NOTE — Telephone Encounter (Signed)
"   Called [facility] advised [facility] does not manage or monitor pt's INRs therefor we cannot refill this Warfarin rx on 04/10/24 see note in Epic. Advised to send rx refill to pt's PCP. Denied Warfarin refill through our office.  "

## 2024-09-17 ENCOUNTER — Ambulatory Visit: Admitting: Cardiology
